# Patient Record
Sex: Female | Born: 2000 | Race: White | Hispanic: No | Marital: Single | Smoking: Never smoker
Health system: Southern US, Community
[De-identification: ages and names within clinical notes are randomized; demographics above are authoritative.]

## PROBLEM LIST (undated history)

## (undated) ENCOUNTER — Emergency Department: Discharge status: Absent without leave | Payer: Self-pay

## (undated) DIAGNOSIS — Z23 Encounter for immunization: Secondary | ICD-10-CM

## (undated) DIAGNOSIS — L309 Dermatitis, unspecified: Secondary | ICD-10-CM

## (undated) DIAGNOSIS — R519 Headache, unspecified: Secondary | ICD-10-CM

## (undated) DIAGNOSIS — K8050 Calculus of bile duct without cholangitis or cholecystitis without obstruction: Secondary | ICD-10-CM

## (undated) DIAGNOSIS — F419 Anxiety disorder, unspecified: Secondary | ICD-10-CM

## (undated) DIAGNOSIS — A4902 Methicillin resistant Staphylococcus aureus infection, unspecified site: Secondary | ICD-10-CM

## (undated) HISTORY — DX: Encounter for immunization: Z23

## (undated) HISTORY — DX: Dermatitis, unspecified: L30.9

## (undated) HISTORY — DX: Methicillin resistant Staphylococcus aureus infection, unspecified site: A49.02

## (undated) HISTORY — DX: Anxiety disorder, unspecified: F41.9

## (undated) HISTORY — PX: CHOLECYSTECTOMY: SHX55

## (undated) HISTORY — PX: WISDOM TOOTH EXTRACTION: SHX21

---
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
## 2018-04-17 ENCOUNTER — Other Ambulatory Visit: Payer: Self-pay | Admitting: Obstetrics and Gynecology

## 2018-05-23 ENCOUNTER — Other Ambulatory Visit: Payer: Self-pay | Admitting: Obstetrics and Gynecology

## 2018-05-28 ENCOUNTER — Other Ambulatory Visit: Payer: Self-pay

## 2018-05-28 MED ORDER — NORETHIN ACE-ETH ESTRAD-FE 1-20 MG-MCG(24) PO TABS: 1.0000 | ORAL_TABLET | Freq: Every day | ORAL | 0 refills | Status: DC

## 2018-05-28 NOTE — Telephone Encounter (Signed)
Pt calling for refill of bcp to be sent to [facility] on [address] in [place].  [phone]  Left detailed msg refill eRx'd.

## 2018-06-02 ENCOUNTER — Ambulatory Visit: Payer: BC Managed Care – PPO | Admitting: Obstetrics and Gynecology

## 2018-06-22 NOTE — Patient Instructions (Signed)
I value your feedback and entrusting us with your care. If you get a [facility] patient survey, I would appreciate you taking the time to let us know about your experience today. Thank you! 

## 2018-06-22 NOTE — Progress Notes (Signed)
PCP:  Pa, [facility]   Chief Complaint  Patient presents with  . Gynecologic Exam  . Urinary Tract Infection    burning sensation when urinates, no blood noticed, frequency urinating x 2 weeks     HPI:      [name] is a 18 y.o. No obstetric history on file. who LMP was Patient's last menstrual period was 05/23/2018 (approximate)., presents today for her annual examination.  Her menses are regular every 28-30 days, lasting 4 days.  Dysmenorrhea minimal. She does not have intermenstrual bleeding. Has good cycle control with OCPs.  Sex activity: not currently active Hx of STDs: none Was having vaginal pain with sex last yr. Had neg office eval and referred to [facility]ody[facility]. Pt saw her mom's GYN who did bx of 2 tears vaginally and was found to have eczema. Sx resolved and pt no longer has sx.   She does complain of occas pain ext vaginal area that burns with urination and wiping, sx increased for past 2 wks. Also has urinary frequency/urgency and pain with voiding if she holds it. Drinks lots of caffeine. Uses dove sens skin soap, no dryer sheets, but wears thongs regularly. No increased d/c, odor.   There is no FH of breast cancer. There is no FH of ovarian cancer. The patient does not do self-breast exams.  Tobacco use: The patient denies current or previous tobacco use. Alcohol use: social drinker No drug use.  Exercise: occas active  She does get adequate calcium and Vitamin D in her diet.  Gardasil vaccine completed.   Past Medical History:  Diagnosis Date  . Eczema 11/10/2017  . MRSA infection   . Vaccine for human papilloma virus (HPV) types 6, 11, 16, and 18 administered     History reviewed. No pertinent surgical history.  Family History  Problem Relation Age of Onset  . Heart disease Maternal Grandmother   . Hypertension Maternal Grandmother   . Uterine cancer Paternal Grandmother        tested/treated  . Heart disease Paternal  Grandmother     Social History   Socioeconomic History  . Marital status: Single    Spouse name: Not on file  . Number of children: Not on file  . Years of education: Not on file  . Highest education level: Not on file  Occupational History  . Not on file  Social Needs  . Financial resource strain: Not on file  . Food insecurity:    Worry: Not on file    Inability: Not on file  . Transportation needs:    Medical: Not on file    Non-medical: Not on file  Tobacco Use  . Smoking status: Never Smoker  . Smokeless tobacco: Never Used  Substance and Sexual Activity  . Alcohol use: Yes    Frequency: Never  . Drug use: No  . Sexual activity: Not Currently    Birth control/protection: Pill  Lifestyle  . Physical activity:    Days per week: Not on file    Minutes per session: Not on file  . Stress: Not on file  Relationships  . Social connections:    Talks on phone: Not on file    Gets together: Not on file    Attends religious service: Not on file    Active member of club or organization: Not on file    Attends meetings of clubs or organizations: Not on file    Relationship status: Not on file  .  Intimate partner violence:    Fear of current or ex partner: Not on file    Emotionally abused: Not on file    Physically abused: Not on file    Forced sexual activity: Not on file  Other Topics Concern  . Not on file  Social History Narrative  . Not on file    Current Meds  Medication Sig  . Norethindrone Acetate-Ethinyl Estrad-FE (MICROGESTIN 24 FE) 1-20 MG-MCG(24) tablet Take 1 tablet by mouth daily.  . [DISCONTINUED] Norethindrone Acetate-Ethinyl Estrad-FE (MICROGESTIN 24 FE) 1-20 MG-MCG(24) tablet Take 1 tablet by mouth daily.     ROS:  Review of Systems  Constitutional: Negative for fatigue, fever and unexpected weight change.  Respiratory: Negative for cough, shortness of breath and wheezing.   Cardiovascular: Negative for chest pain, palpitations and leg  swelling.  Gastrointestinal: Negative for blood in stool, constipation, diarrhea, nausea and vomiting.  Endocrine: Negative for cold intolerance, heat intolerance and polyuria.  Genitourinary: Positive for dysuria, frequency, urgency and vaginal pain. Negative for dyspareunia, flank pain, genital sores, hematuria, menstrual problem, pelvic pain, vaginal bleeding and vaginal discharge.  Musculoskeletal: Negative for back pain, joint swelling and myalgias.  Skin: Negative for rash.  Neurological: Negative for dizziness, syncope, light-headedness, numbness and headaches.  Hematological: Negative for adenopathy.  Psychiatric/Behavioral: Negative for agitation, confusion, sleep disturbance and suicidal ideas. The patient is not nervous/anxious.      Objective: BP 100/70   Pulse 85   Ht 5\' 4"  (1.626 m)   Wt 135 lb (61.2 kg)   LMP 05/23/2018 (Approximate)   BMI 23.17 kg/m    Physical Exam Constitutional:      Appearance: She is well-developed.  Genitourinary:     Vulva, vagina, cervix, uterus, right adnexa and left adnexa normal.     Vulval tenderness present.     No vulval condylomata, lesion, ulcerations or rash noted.        No vaginal discharge, erythema or tenderness.     No cervical polyp.     Uterus is not enlarged or tender.     No right or left adnexal mass present.     Right adnexa not tender.     Left adnexa not tender.  Neck:     Musculoskeletal: Normal range of motion.     Thyroid: No thyromegaly.  Cardiovascular:     Rate and Rhythm: Normal rate and regular rhythm.     Heart sounds: Normal heart sounds. No murmur.  Pulmonary:     Effort: Pulmonary effort is normal.     Breath sounds: Normal breath sounds.  Chest:     Breasts:        Right: No mass, nipple discharge, skin change or tenderness.        Left: No mass, nipple discharge, skin change or tenderness.  Abdominal:     Palpations: Abdomen is soft.     Tenderness: There is no abdominal tenderness. There  is no guarding.  Musculoskeletal: Normal range of motion.  Neurological:     Mental Status: She is alert and oriented to person, place, and time.     Cranial Nerves: No cranial nerve deficit.  Psychiatric:        Behavior: Behavior normal.  Vitals signs reviewed.   RESULTS:  Results for orders placed or performed in visit on 06/23/18 (from the past 24 hour(s))  POCT Urinalysis Dipstick     Status: Normal   Collection Time: 06/23/18  9:47 AM  Result Value Ref Range  Color, UA yellow    Clarity, UA clear    Glucose, UA Negative Negative   Bilirubin, UA neg    Ketones, UA neg    Spec Grav, UA 1.025 1.010 - 1.025   Blood, UA neg    pH, UA 6.0 5.0 - 8.0   Protein, UA Negative Negative   Urobilinogen, UA     Nitrite, UA neg    Leukocytes, UA Negative Negative   Appearance     Odor       Assessment/Plan: Encounter for annual routine gynecological examination  Screening for STD (sexually transmitted disease) - Plan: Cervicovaginal ancillary only  Encounter for surveillance of contraceptive pills - OCP RF - Plan: Norethindrone Acetate-Ethinyl Estrad-FE (MICROGESTIN 24 FE) 1-20 MG-MCG(24) tablet  Dysuria - Neg UA. More than likely vaginal irritation from thong use. Wear non-thong underwear/OTC hydrocortisone crm. F/u prn.   Urinary frequency - Neg UA. Most likely caffeine use. D/C and see if sx improve. F/u prn.  - Plan: POCT Urinalysis Dipstick  Meds ordered this encounter  Medications  . Norethindrone Acetate-Ethinyl Estrad-FE (MICROGESTIN 24 FE) 1-20 MG-MCG(24) tablet    Sig: Take 1 tablet by mouth daily.    Dispense:  3 Package    Refill:  3    Order Specific Question:   Supervising Provider    Answer:   Nadara Mustard [[id number]]             GYN counsel family planning choices, adequate intake of calcium and vitamin D, diet and exercise     F/U  Return in about 1 year (around 06/24/2019).  Jackolyn Geron B. Merlin Golden, PA-C 06/23/2018 10:22 AM

## 2018-06-23 ENCOUNTER — Other Ambulatory Visit (HOSPITAL_COMMUNITY)
Admission: RE | Admit: 2018-06-23 | Discharge: 2018-06-23 | Disposition: A | Discharge status: Home / Self Care | Payer: BC Managed Care – PPO | Source: Ambulatory Visit | Attending: Obstetrics and Gynecology | Admitting: Obstetrics and Gynecology

## 2018-06-23 ENCOUNTER — Ambulatory Visit (INDEPENDENT_AMBULATORY_CARE_PROVIDER_SITE_OTHER): Payer: BC Managed Care – PPO | Admitting: Obstetrics and Gynecology

## 2018-06-23 ENCOUNTER — Encounter: Payer: Self-pay | Admitting: Obstetrics and Gynecology

## 2018-06-23 VITALS — BP 100/70 | HR 85 | Ht 64.0 in | Wt 135.0 lb

## 2018-06-23 DIAGNOSIS — Z3041 Encounter for surveillance of contraceptive pills: Secondary | ICD-10-CM

## 2018-06-23 DIAGNOSIS — Z01419 Encounter for gynecological examination (general) (routine) without abnormal findings: Secondary | ICD-10-CM | POA: Diagnosis not present

## 2018-06-23 DIAGNOSIS — R350 Frequency of micturition: Secondary | ICD-10-CM

## 2018-06-23 DIAGNOSIS — R300 Dysuria: Secondary | ICD-10-CM

## 2018-06-23 DIAGNOSIS — Z113 Encounter for screening for infections with a predominantly sexual mode of transmission: Secondary | ICD-10-CM | POA: Diagnosis not present

## 2018-06-23 LAB — POCT URINALYSIS DIPSTICK
BILIRUBIN UA: NEGATIVE
Glucose, UA: NEGATIVE
Ketones, UA: NEGATIVE
Leukocytes, UA: NEGATIVE
Nitrite, UA: NEGATIVE
PH UA: 6 (ref 5.0–8.0)
Protein, UA: NEGATIVE
RBC UA: NEGATIVE
Spec Grav, UA: 1.025 (ref 1.010–1.025)

## 2018-06-23 MED ORDER — NORETHIN ACE-ETH ESTRAD-FE 1-20 MG-MCG(24) PO TABS: 1.0000 | ORAL_TABLET | Freq: Every day | ORAL | 3 refills | Status: DC

## 2018-06-24 LAB — CERVICOVAGINAL ANCILLARY ONLY
Chlamydia: NEGATIVE
Neisseria Gonorrhea: NEGATIVE

## 2018-12-21 ENCOUNTER — Encounter: Payer: Self-pay | Admitting: Obstetrics and Gynecology

## 2018-12-29 ENCOUNTER — Other Ambulatory Visit: Payer: Self-pay

## 2018-12-29 DIAGNOSIS — Z20822 Contact with and (suspected) exposure to covid-19: Secondary | ICD-10-CM

## 2018-12-30 LAB — NOVEL CORONAVIRUS, NAA: SARS-CoV-2, NAA: NOT DETECTED

## 2019-02-11 DIAGNOSIS — R519 Headache, unspecified: Secondary | ICD-10-CM | POA: Insufficient documentation

## 2019-03-15 ENCOUNTER — Other Ambulatory Visit (HOSPITAL_COMMUNITY): Payer: Self-pay | Admitting: Neurology

## 2019-03-15 ENCOUNTER — Other Ambulatory Visit: Payer: Self-pay | Admitting: Neurology

## 2019-03-15 DIAGNOSIS — R519 Headache, unspecified: Secondary | ICD-10-CM

## 2019-03-17 ENCOUNTER — Other Ambulatory Visit: Payer: Self-pay

## 2019-03-17 ENCOUNTER — Other Ambulatory Visit: Payer: Self-pay | Admitting: *Deleted

## 2019-03-17 ENCOUNTER — Ambulatory Visit
Admission: RE | Admit: 2019-03-17 | Discharge: 2019-03-17 | Disposition: A | Discharge status: Home / Self Care | Payer: BC Managed Care – PPO | Source: Ambulatory Visit | Attending: Neurology | Admitting: Neurology

## 2019-03-17 DIAGNOSIS — R519 Headache, unspecified: Secondary | ICD-10-CM | POA: Insufficient documentation

## 2019-03-17 DIAGNOSIS — Z20822 Contact with and (suspected) exposure to covid-19: Secondary | ICD-10-CM

## 2019-03-18 LAB — NOVEL CORONAVIRUS, NAA: SARS-CoV-2, NAA: NOT DETECTED

## 2019-06-30 ENCOUNTER — Telehealth: Payer: Self-pay

## 2019-06-30 NOTE — Telephone Encounter (Signed)
Pt calling triage stating she is wanting a refill on her OCP's but she is due for annual. Please schedule.

## 2019-07-01 ENCOUNTER — Other Ambulatory Visit: Payer: Self-pay

## 2019-07-01 DIAGNOSIS — Z3041 Encounter for surveillance of contraceptive pills: Secondary | ICD-10-CM

## 2019-07-01 MED ORDER — MICROGESTIN 24 FE 1-20 MG-MCG PO TABS: 1.0000 | ORAL_TABLET | Freq: Every day | ORAL | 0 refills | Status: DC

## 2019-07-01 NOTE — Telephone Encounter (Signed)
RF sent.

## 2019-07-01 NOTE — Telephone Encounter (Signed)
Patient is schedule for 07/20/19. Patient needs a refill to get to her appointment

## 2019-07-20 ENCOUNTER — Encounter: Payer: Self-pay | Admitting: Obstetrics and Gynecology

## 2019-07-20 ENCOUNTER — Ambulatory Visit (INDEPENDENT_AMBULATORY_CARE_PROVIDER_SITE_OTHER): Payer: BC Managed Care – PPO | Admitting: Obstetrics and Gynecology

## 2019-07-20 ENCOUNTER — Other Ambulatory Visit: Payer: Self-pay

## 2019-07-20 ENCOUNTER — Other Ambulatory Visit (HOSPITAL_COMMUNITY)
Admission: RE | Admit: 2019-07-20 | Discharge: 2019-07-20 | Disposition: A | Discharge status: Home / Self Care | Payer: BC Managed Care – PPO | Source: Ambulatory Visit | Attending: Obstetrics and Gynecology | Admitting: Obstetrics and Gynecology

## 2019-07-20 VITALS — BP 100/70 | Ht 64.0 in | Wt 139.0 lb

## 2019-07-20 DIAGNOSIS — Z01419 Encounter for gynecological examination (general) (routine) without abnormal findings: Secondary | ICD-10-CM | POA: Diagnosis not present

## 2019-07-20 DIAGNOSIS — Z3041 Encounter for surveillance of contraceptive pills: Secondary | ICD-10-CM | POA: Diagnosis not present

## 2019-07-20 DIAGNOSIS — Z113 Encounter for screening for infections with a predominantly sexual mode of transmission: Secondary | ICD-10-CM | POA: Insufficient documentation

## 2019-07-20 MED ORDER — MICROGESTIN 24 FE 1-20 MG-MCG PO TABS: 1.0000 | ORAL_TABLET | Freq: Every day | ORAL | 3 refills | Status: DC

## 2019-07-20 NOTE — Patient Instructions (Signed)
I value your feedback and entrusting us with your care. If you get a [facility] patient survey, I would appreciate you taking the time to let us know about your experience today. Thank you!  As of April 22, 2019, your lab results will be released to your MyChart immediately, before I even have a chance to see them. Please give me time to review them and contact you if there are any abnormalities. Thank you for your patience.  

## 2019-07-20 NOTE — Progress Notes (Signed)
PCP:  [name], MD   Chief Complaint  Patient presents with  . Gynecologic Exam     HPI:      [name] is a 19 y.o. No obstetric history on file. who LMP was Patient's last menstrual period was 07/06/2019 (approximate)., presents today for her annual examination.  Her menses are regular every 28-30 days, lasting 3 days.  Dysmenorrhea minimal. She does not have intermenstrual bleeding. Has good cycle control with OCPs.  Sex activity:  currently active; contraception--OCPs. Hx of STDs: none Hx of vaginal eczema, sx improved.   There is no FH of breast cancer. There is no FH of ovarian cancer. The patient does self-breast exams.  Tobacco use: The patient denies current or previous tobacco use. Alcohol use: social drinker No drug use.  Exercise: occas active  She does get adequate calcium but not Vitamin D in her diet.  Gardasil vaccine completed.   Past Medical History:  Diagnosis Date  . Eczema 11/10/2017  . MRSA infection   . Vaccine for human papilloma virus (HPV) types 6, 11, 16, and 18 administered     History reviewed. No pertinent surgical history.  Family History  Problem Relation Age of Onset  . Heart disease Maternal Grandmother   . Hypertension Maternal Grandmother   . Uterine cancer Paternal Grandmother        tested/treated  . Heart disease Paternal Grandmother     Social History   Socioeconomic History  . Marital status: Single    Spouse name: Not on file  . Number of children: Not on file  . Years of education: Not on file  . Highest education level: Not on file  Occupational History  . Not on file  Tobacco Use  . Smoking status: Never Smoker  . Smokeless tobacco: Never Used  Substance and Sexual Activity  . Alcohol use: Yes  . Drug use: No  . Sexual activity: Yes    Birth control/protection: Pill  Other Topics Concern  . Not on file  Social History Narrative  . Not on file   Social Determinants of Health    Financial Resource Strain:   . Difficulty of Paying Living Expenses: Not on file  Food Insecurity:   . Worried About Programme researcher, broadcasting/film/video in the Last Year: Not on file  . Ran Out of Food in the Last Year: Not on file  Transportation Needs:   . Lack of Transportation (Medical): Not on file  . Lack of Transportation (Non-Medical): Not on file  Physical Activity:   . Days of Exercise per Week: Not on file  . Minutes of Exercise per Session: Not on file  Stress:   . Feeling of Stress : Not on file  Social Connections:   . Frequency of Communication with Friends and Family: Not on file  . Frequency of Social Gatherings with Friends and Family: Not on file  . Attends Religious Services: Not on file  . Active Member of Clubs or Organizations: Not on file  . Attends Banker Meetings: Not on file  . Marital Status: Not on file  Intimate Partner Violence:   . Fear of Current or Ex-Partner: Not on file  . Emotionally Abused: Not on file  . Physically Abused: Not on file  . Sexually Abused: Not on file    Current Meds  Medication Sig  . Norethindrone Acetate-Ethinyl Estrad-FE (MICROGESTIN 24 FE) 1-20 MG-MCG(24) tablet Take 1 tablet by mouth daily.  . [DISCONTINUED]  Norethindrone Acetate-Ethinyl Estrad-FE (MICROGESTIN 24 FE) 1-20 MG-MCG(24) tablet Take 1 tablet by mouth daily.     ROS:  Review of Systems  Constitutional: Negative for fatigue, fever and unexpected weight change.  Respiratory: Negative for cough, shortness of breath and wheezing.   Cardiovascular: Negative for chest pain, palpitations and leg swelling.  Gastrointestinal: Negative for blood in stool, constipation, diarrhea, nausea and vomiting.  Endocrine: Negative for cold intolerance, heat intolerance and polyuria.  Genitourinary: Negative for dyspareunia, dysuria, flank pain, frequency, genital sores, hematuria, menstrual problem, pelvic pain, urgency, vaginal bleeding, vaginal discharge and vaginal pain.   Musculoskeletal: Negative for back pain, joint swelling and myalgias.  Skin: Negative for rash.  Neurological: Positive for headaches. Negative for dizziness, syncope, light-headedness and numbness.  Hematological: Negative for adenopathy.  Psychiatric/Behavioral: Negative for agitation, confusion, sleep disturbance and suicidal ideas. The patient is not nervous/anxious.      Objective: BP 100/70   Ht 5\' 4"  (1.626 m)   Wt 139 lb (63 kg)   LMP 07/06/2019 (Approximate)   BMI 23.86 kg/m    Physical Exam Constitutional:      Appearance: She is well-developed.  Genitourinary:     Vulva, vagina, cervix, uterus, right adnexa and left adnexa normal.     No vulval lesion or tenderness noted.     No vaginal discharge, erythema or tenderness.     No cervical polyp.     Uterus is not enlarged or tender.     No right or left adnexal mass present.     Right adnexa not tender.     Left adnexa not tender.  Neck:     Thyroid: No thyromegaly.  Cardiovascular:     Rate and Rhythm: Normal rate and regular rhythm.     Heart sounds: Normal heart sounds. No murmur.  Pulmonary:     Effort: Pulmonary effort is normal.     Breath sounds: Normal breath sounds.  Chest:     Breasts:        Right: No mass, nipple discharge, skin change or tenderness.        Left: No mass, nipple discharge, skin change or tenderness.  Abdominal:     Palpations: Abdomen is soft.     Tenderness: There is no abdominal tenderness. There is no guarding.  Musculoskeletal:        General: Normal range of motion.     Cervical back: Normal range of motion.  Neurological:     General: No focal deficit present.     Mental Status: She is alert and oriented to person, place, and time.     Cranial Nerves: No cranial nerve deficit.  Skin:    General: Skin is warm and dry.  Psychiatric:        Mood and Affect: Mood normal.        Behavior: Behavior normal.        Thought Content: Thought content normal.        Judgment:  Judgment normal.  Vitals reviewed.    Assessment/Plan: Encounter for annual routine gynecological examination  Screening for STD (sexually transmitted disease) - Plan: Cervicovaginal ancillary only  Encounter for surveillance of contraceptive pills - Plan: Norethindrone Acetate-Ethinyl Estrad-FE (MICROGESTIN 24 FE) 1-20 MG-MCG(24) tablet; OCP RF  Meds ordered this encounter  Medications  . Norethindrone Acetate-Ethinyl Estrad-FE (MICROGESTIN 24 FE) 1-20 MG-MCG(24) tablet    Sig: Take 1 tablet by mouth daily.    Dispense:  3 Package    Refill:  3  Order Specific Question:   Supervising Provider    Answer:   Gae Dry [[id number]]             GYN counsel adequate intake of calcium and vitamin D, diet and exercise     F/U  Return in about 1 year (around 07/19/2020).  Tilla Wilborn B. Zayven Powe, PA-C 07/20/2019 10:36 AM

## 2019-07-22 LAB — CERVICOVAGINAL ANCILLARY ONLY
Chlamydia: NEGATIVE
Comment: NEGATIVE
Comment: NORMAL
Neisseria Gonorrhea: NEGATIVE

## 2020-04-28 ENCOUNTER — Telehealth: Payer: Self-pay

## 2020-04-28 NOTE — Telephone Encounter (Signed)
Advised she should have 1 refill of 3 packs left at pharmacy. Pt to contact pharmacy and explain situation. They should be able to fill or do override if issue w/insurance to get her at least one pack til she returns to college.

## 2020-04-28 NOTE — Telephone Encounter (Signed)
Patient spoke to on call service. She has 3 pills left in current pack she brought with her home from college, but she left a full pack she was to start on Sunday. Requesting refill send to [facility].

## 2020-08-26 ENCOUNTER — Telehealth: Payer: Self-pay | Admitting: Obstetrics and Gynecology

## 2020-08-26 DIAGNOSIS — Z3041 Encounter for surveillance of contraceptive pills: Secondary | ICD-10-CM

## 2020-09-07 MED ORDER — MICROGESTIN 24 FE 1-20 MG-MCG PO TABS: 1.0000 | ORAL_TABLET | Freq: Every day | ORAL | 0 refills | Status: DC

## 2020-09-07 NOTE — Telephone Encounter (Signed)
Patient is away at college and requested to schedule for June. Patient is scheduled for 10/18/20 with [facility] for annual. Patient is requesting refills to get to her appointment

## 2020-09-07 NOTE — Telephone Encounter (Signed)
Pt aware refills eRx'd.

## 2020-09-07 NOTE — Telephone Encounter (Signed)
Pt calling; is unable to request refill of bc thru MyChart d/t it expired.  Needs refill. What to do?  [phone]

## 2020-09-07 NOTE — Addendum Note (Signed)
Addended by: Loran Senters D on: 09/07/2020 04:21 PM   Modules accepted: Orders

## 2020-10-01 IMAGING — MR MR HEAD W/O CM
12 series · 45 of 48 positions shown · non-contrast
Comparison: No pertinent prior studies available for comparison.

CLINICAL DATA: Headache disorder. Additional history provided:
Constant daily headaches for 6 months with occasional sharp pain in
left side, some light and sound sensitivity

EXAM:
MRI HEAD WITHOUT CONTRAST
TECHNIQUE: Multiplanar, multiecho pulse sequences of the brain and surrounding
structures were obtained without intravenous contrast.

[Series 5: ax dwi_tracew · axial · 3.0mm · 0.60mm/px · z∈[-137,+16]mm · 4 of 48 slices shown]
[im 1/48]
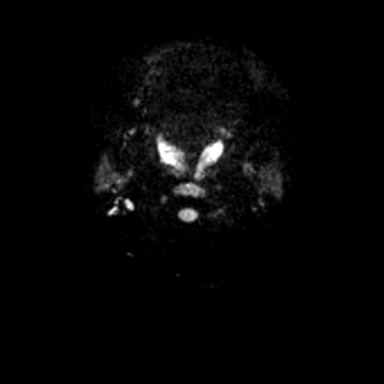
[im 16/48]
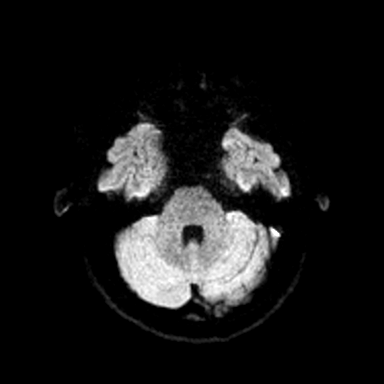
[im 32/48]
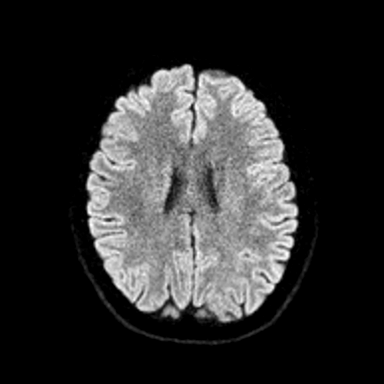
[im 48/48]
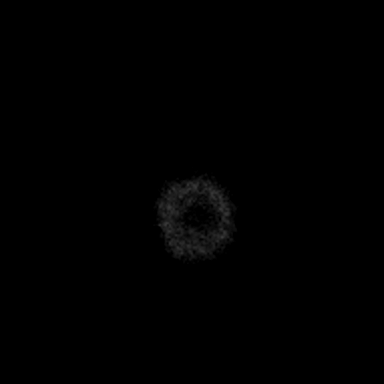

[Series 6: ax dwi_adc · axial · 3.0mm · 0.60mm/px · z∈[-137,+16]mm · 3 of 48 slices shown]
[im 1/48]
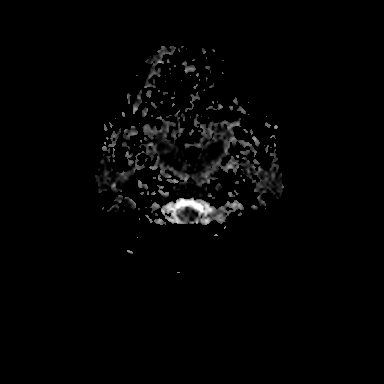
[im 24/48]
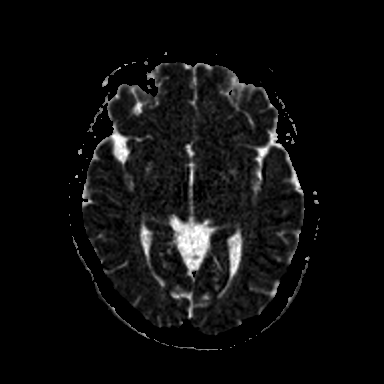
[im 48/48]
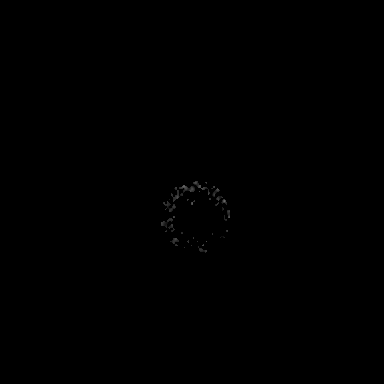

[Series 7: T1 · sagittal · 5.0mm · 0.62mm/px · 2 of 23 slices shown (1 of 2)]
[im 1/23]
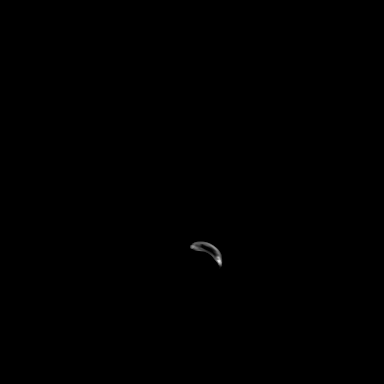
[im 23/23]
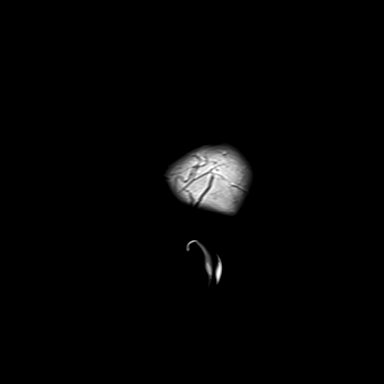

[Series 8: cor dwi_tracew · coronal · 5.0mm · 0.60mm/px · 3 of 40 slices shown]
[im 1/40]
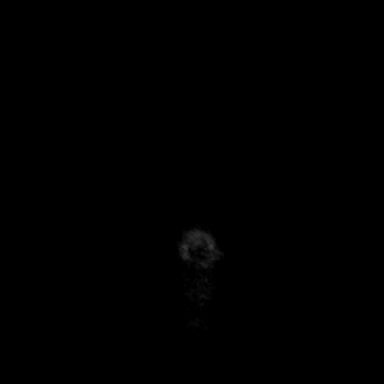
[im 20/40]
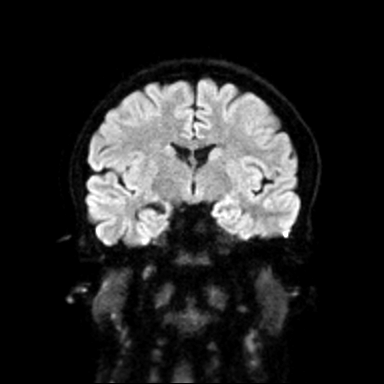
[im 40/40]
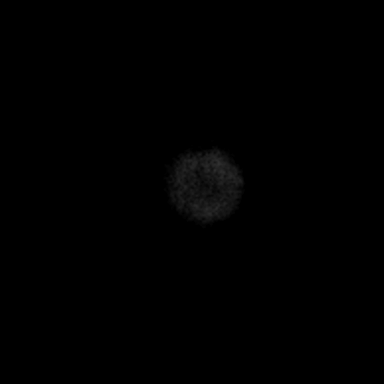

[Series 9: cor dwi_adc · coronal · 5.0mm · 0.60mm/px · 3 of 39 slices shown]
[im 1/39]
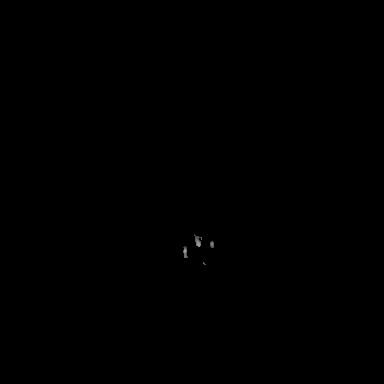
[im 20/39]
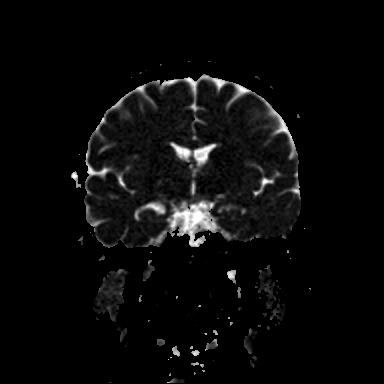
[im 39/39]
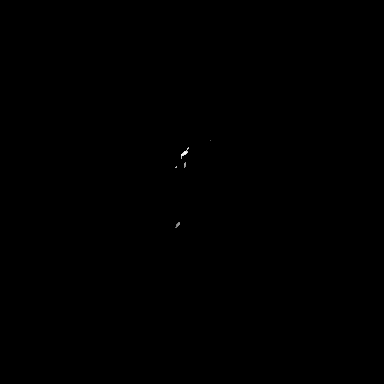

[Series 10: T2 · axial · 5.0mm · 0.53mm/px · z∈[-137,+17]mm · 2 of 27 slices shown (1 of 2)]
[im 1/27]
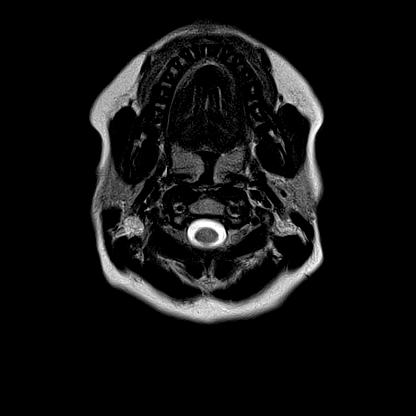
[im 27/27]
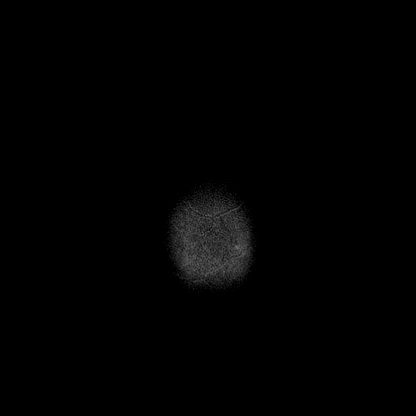

[Series 11: mag_images · axial · 3.0mm · 0.90mm/px · z∈[-148,+27]mm · 4 of 60 slices shown]
[im 1/60]
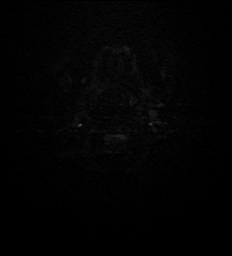
[im 20/60]
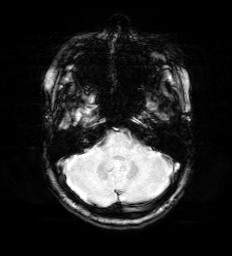
[im 40/60]
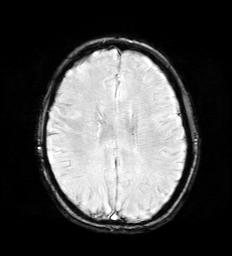
[im 60/60]
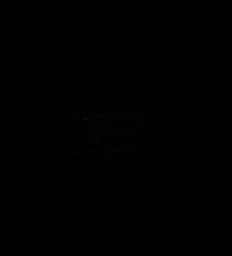

[Series 12: pha_images · axial · 3.0mm · 0.90mm/px · z∈[-148,+18]mm · 4 of 57 slices shown]
[im 1/57]
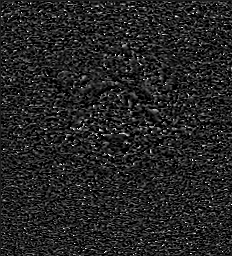
[im 19/57]
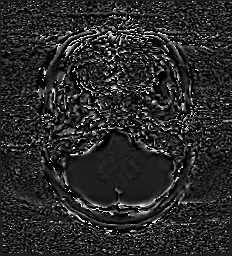
[im 38/57]
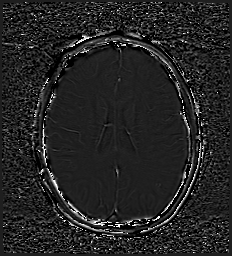
[im 57/57]
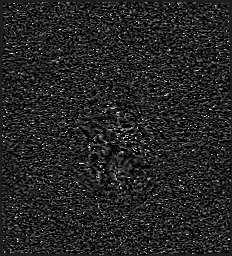

[Series 13: swi_images · axial · 3.0mm · 0.90mm/px · z∈[-148,+27]mm · 4 of 60 slices shown]
[im 1/60]
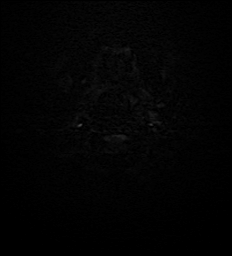
[im 20/60]
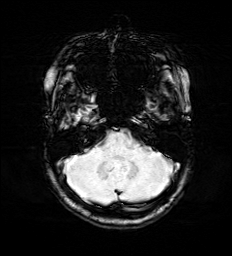
[im 40/60]
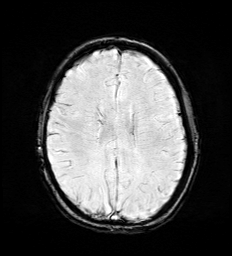
[im 60/60]
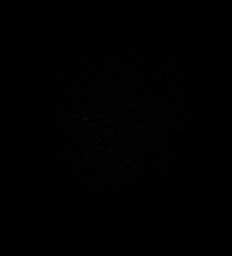

[Series 15: FLAIR · axial · 3.0mm · 0.53mm/px · z∈[-140,+20]mm · 4 of 55 slices shown]
[im 1/55]
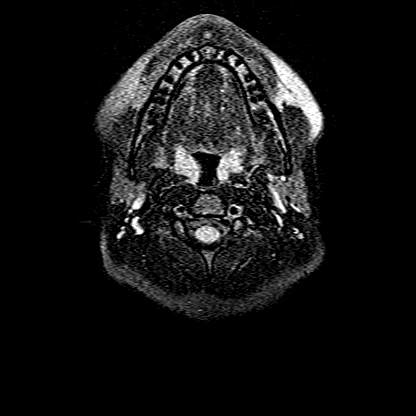
[im 19/55]
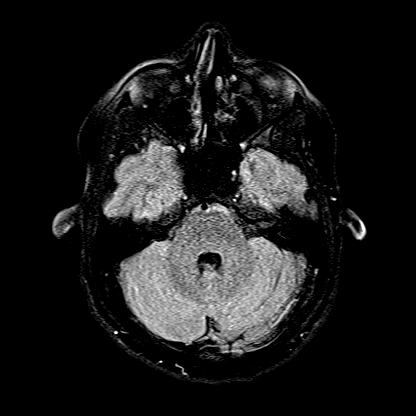
[im 37/55]
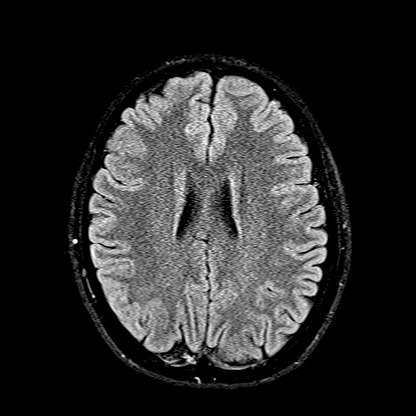
[im 55/55]
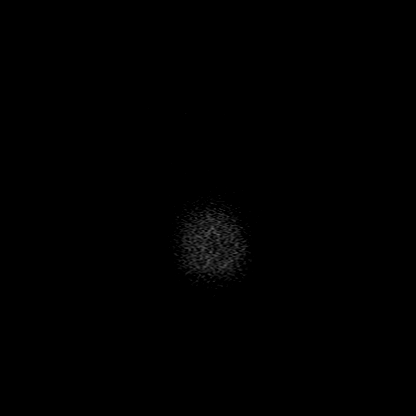

[Series 16: T1 · axial · 1.0mm · 0.98mm/px · z∈[-145,+29]mm · 10 of 176 slices shown (2 of 2)]
[im 1/176]
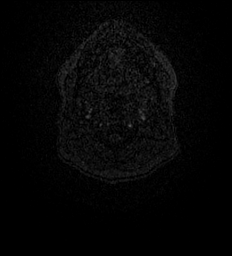
[im 15/176]
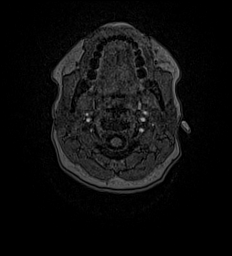
[im 30/176]
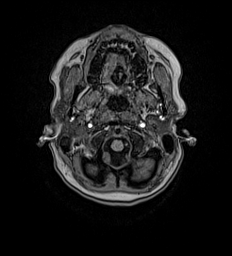
[im 44/176]
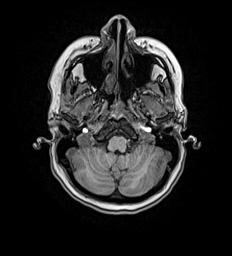
[im 59/176]
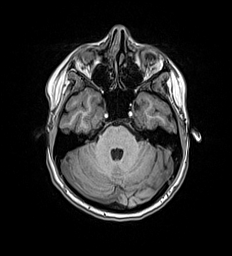
[im 73/176]
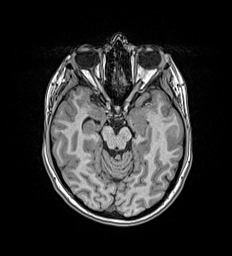
[im 103/176]
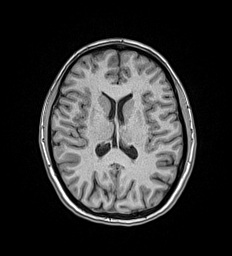
[im 117/176]
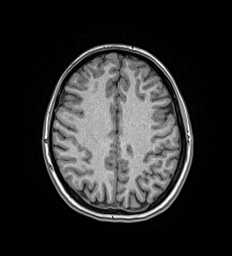
[im 146/176]
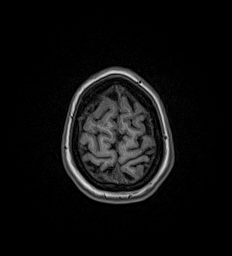
[im 176/176]
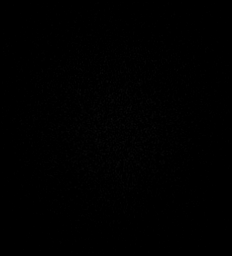

[Series 17: T2 · coronal · 5.0mm · 0.57mm/px · 2 of 29 slices shown (2 of 2)]
[im 1/29]
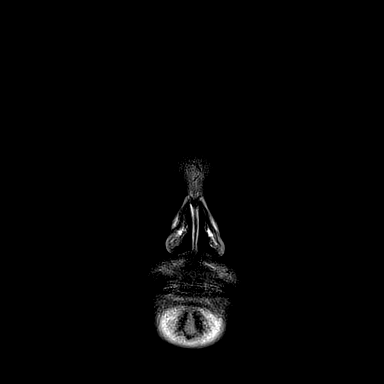
[im 29/29]
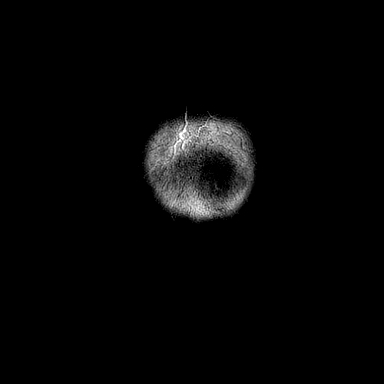

[45 of 48 positions shown; findings below may reference images not displayed]

FINDINGS: Brain:

There is no evidence of acute infarct.

No evidence of intracranial mass.

No midline shift or extra-axial fluid collection.

No chronic intracranial blood products.

No focal parenchymal signal abnormality is identified.

Cerebral volume is normal.

Vascular: Flow voids maintained within the proximal large arterial
vessels.

Skull and upper cervical spine: No focal marrow lesion

Sinuses/Orbits: Visualized orbits demonstrate no acute abnormality.
No significant paranasal sinus disease or mastoid effusion.
IMPRESSION: Normal MRI appearance of the brain. No evidence of acute
intracranial abnormality

## 2020-10-18 ENCOUNTER — Other Ambulatory Visit (HOSPITAL_COMMUNITY)
Admission: RE | Admit: 2020-10-18 | Discharge: 2020-10-18 | Disposition: A | Discharge status: Home / Self Care | Payer: BC Managed Care – PPO | Source: Ambulatory Visit | Attending: Obstetrics and Gynecology | Admitting: Obstetrics and Gynecology

## 2020-10-18 ENCOUNTER — Ambulatory Visit (INDEPENDENT_AMBULATORY_CARE_PROVIDER_SITE_OTHER): Payer: BC Managed Care – PPO | Admitting: Obstetrics and Gynecology

## 2020-10-18 ENCOUNTER — Encounter: Payer: Self-pay | Admitting: Obstetrics and Gynecology

## 2020-10-18 ENCOUNTER — Other Ambulatory Visit: Payer: Self-pay

## 2020-10-18 VITALS — BP 96/70 | Ht 64.0 in | Wt 128.0 lb

## 2020-10-18 DIAGNOSIS — Z124 Encounter for screening for malignant neoplasm of cervix: Secondary | ICD-10-CM

## 2020-10-18 DIAGNOSIS — Z01419 Encounter for gynecological examination (general) (routine) without abnormal findings: Secondary | ICD-10-CM

## 2020-10-18 DIAGNOSIS — Z113 Encounter for screening for infections with a predominantly sexual mode of transmission: Secondary | ICD-10-CM

## 2020-10-18 DIAGNOSIS — Z3041 Encounter for surveillance of contraceptive pills: Secondary | ICD-10-CM | POA: Diagnosis not present

## 2020-10-18 MED ORDER — MICROGESTIN 24 FE 1-20 MG-MCG PO TABS: 1.0000 | ORAL_TABLET | Freq: Every day | ORAL | 3 refills | Status: DC

## 2020-10-18 NOTE — Patient Instructions (Signed)
I value your feedback and you entrusting us with your care. If you get a [facility] patient survey, I would appreciate you taking the time to let us know about your experience today. Thank you! ? ? ?

## 2020-10-18 NOTE — Progress Notes (Signed)
PCP:  [name], MD   Chief Complaint  Patient presents with  . Gynecologic Exam     HPI:      [name] is a 20 y.o. No obstetric history on file. who LMP was Patient's last menstrual period was 10/04/2020 (approximate)., presents today for her annual examination.  Her menses are regular every 28-30 days, lasting 4 days.  Dysmenorrhea minimal. She does not have intermenstrual bleeding. Has good cycle control with OCPs.  Sex activity:  currently active; contraception--OCPs. Last pap: N/A due to age Hx of STDs: none Hx of vaginal eczema, sx improved.   There is no FH of breast cancer. There is no FH of ovarian cancer. The patient does self-breast exams.  Tobacco use: The patient denies current or previous tobacco use. Alcohol use: none No drug use.  Exercise: occas active  She does get adequate calcium and Vitamin D in her diet.  Gardasil vaccine completed.   Past Medical History:  Diagnosis Date  . Eczema 11/10/2017  . MRSA infection   . Vaccine for human papilloma virus (HPV) types 6, 11, 16, and 18 administered     History reviewed. No pertinent surgical history.  Family History  Problem Relation Age of Onset  . Heart disease Maternal Grandmother   . Hypertension Maternal Grandmother   . Uterine cancer Paternal Grandmother        tested/treated  . Heart disease Paternal Grandmother     Social History   Socioeconomic History  . Marital status: Single    Spouse name: Not on file  . Number of children: Not on file  . Years of education: Not on file  . Highest education level: Not on file  Occupational History  . Not on file  Tobacco Use  . Smoking status: Never Smoker  . Smokeless tobacco: Never Used  Vaping Use  . Vaping Use: Never used  Substance and Sexual Activity  . Alcohol use: Yes  . Drug use: No  . Sexual activity: Yes    Birth control/protection: Pill  Other Topics Concern  . Not on file  Social History Narrative  . Not  on file   Social Determinants of Health   Financial Resource Strain: Not on file  Food Insecurity: Not on file  Transportation Needs: Not on file  Physical Activity: Not on file  Stress: Not on file  Social Connections: Not on file  Intimate Partner Violence: Not on file    Current Meds  Medication Sig  . [DISCONTINUED] Norethindrone Acetate-Ethinyl Estrad-FE (MICROGESTIN 24 FE) 1-20 MG-MCG(24) tablet Take 1 tablet by mouth daily.     ROS:  Review of Systems  Constitutional: Negative for fatigue, fever and unexpected weight change.  Respiratory: Negative for cough, shortness of breath and wheezing.   Cardiovascular: Negative for chest pain, palpitations and leg swelling.  Gastrointestinal: Negative for blood in stool, constipation, diarrhea, nausea and vomiting.  Endocrine: Negative for cold intolerance, heat intolerance and polyuria.  Genitourinary: Negative for dyspareunia, dysuria, flank pain, frequency, genital sores, hematuria, menstrual problem, pelvic pain, urgency, vaginal bleeding, vaginal discharge and vaginal pain.  Musculoskeletal: Negative for back pain, joint swelling and myalgias.  Skin: Negative for rash.  Neurological: Positive for headaches. Negative for dizziness, syncope, light-headedness and numbness.  Hematological: Negative for adenopathy.  Psychiatric/Behavioral: Negative for agitation, confusion, sleep disturbance and suicidal ideas. The patient is not nervous/anxious.      Objective: BP 96/70   Ht 5\' 4"  (1.626 m)   Wt  128 lb (58.1 kg)   LMP 10/04/2020 (Approximate)   BMI 21.97 kg/m    Physical Exam Constitutional:      Appearance: She is well-developed.  Genitourinary:     Vulva normal.     Right Labia: No rash, tenderness or lesions.    Left Labia: No tenderness, lesions or rash.    No vaginal discharge, erythema or tenderness.      Right Adnexa: not tender and no mass present.    Left Adnexa: not tender and no mass present.    No  cervical friability or polyp.     Uterus is not enlarged or tender.  Breasts:     Right: No mass, nipple discharge, skin change or tenderness.     Left: No mass, nipple discharge, skin change or tenderness.    Neck:     Thyroid: No thyromegaly.  Cardiovascular:     Rate and Rhythm: Normal rate and regular rhythm.     Heart sounds: Normal heart sounds. No murmur heard.   Pulmonary:     Effort: Pulmonary effort is normal.     Breath sounds: Normal breath sounds.  Abdominal:     Palpations: Abdomen is soft.     Tenderness: There is no abdominal tenderness. There is no guarding or rebound.  Musculoskeletal:        General: Normal range of motion.     Cervical back: Normal range of motion.  Lymphadenopathy:     Cervical: No cervical adenopathy.  Neurological:     General: No focal deficit present.     Mental Status: She is alert and oriented to person, place, and time.     Cranial Nerves: No cranial nerve deficit.  Skin:    General: Skin is warm and dry.  Psychiatric:        Mood and Affect: Mood normal.        Behavior: Behavior normal.        Thought Content: Thought content normal.        Judgment: Judgment normal.  Vitals reviewed.    Assessment/Plan: Encounter for annual routine gynecological examination  Cervical cancer screening - Plan: Cytology - PAP  Screening for STD (sexually transmitted disease) - Plan: Cytology - PAP  Encounter for surveillance of contraceptive pills - Plan: Norethindrone Acetate-Ethinyl Estrad-FE (MICROGESTIN 24 FE) 1-20 MG-MCG(24) tablet; OCP RF   Meds ordered this encounter  Medications  . Norethindrone Acetate-Ethinyl Estrad-FE (MICROGESTIN 24 FE) 1-20 MG-MCG(24) tablet    Sig: Take 1 tablet by mouth daily.    Dispense:  84 tablet    Refill:  3    Order Specific Question:   Supervising Provider    Answer:   Nadara Mustard [[id number]]             GYN counsel adequate intake of calcium and vitamin D, diet and exercise      F/U  Return in about 1 year (around 10/18/2021).  Vanessa Scott B. Mysha Peeler, PA-C 10/18/2020 3:34 PM

## 2020-10-25 LAB — CYTOLOGY - PAP
Chlamydia: NEGATIVE
Comment: NEGATIVE
Comment: NEGATIVE
Comment: NORMAL
Diagnosis: UNDETERMINED — AB
High risk HPV: NEGATIVE
Neisseria Gonorrhea: NEGATIVE

## 2020-12-28 NOTE — Progress Notes (Signed)
[name], MD   Chief Complaint  Patient presents with   Amenorrhea    Pt's last period was in May, 2 neg UPT's    HPI:      [name] is a 20 y.o. [id number] whose LMP was Patient's last menstrual period was 09/29/2020 (approximate)., presents today for oligomenorrhea since 5/22. On [place] 24, menses usually monthly, lasting 3 days, lighter flow, no BTB, mild dysmen. No late/missed pills. 2 neg UPTs, last one ~12/25/20 before starting new pill pack. She is sex active, no new partners, neg STD testing 6/22. No wt changes/sickness/travel/increased stress. No recent thyroid labs. Neg exam at 6/22 annual.   Past Medical History:  Diagnosis Date   Eczema 11/10/2017   MRSA infection    Vaccine for human papilloma virus (HPV) types 6, 11, 16, and 18 administered     Past Surgical History:  Procedure Laterality Date   WISDOM TOOTH EXTRACTION      Family History  Problem Relation Age of Onset   Heart disease Maternal Grandmother    Hypertension Maternal Grandmother    Uterine cancer Paternal Grandmother        tested/treated   Heart disease Paternal Grandmother     Social History   Socioeconomic History   Marital status: Single    Spouse name: Not on file   Number of children: Not on file   Years of education: Not on file   Highest education level: Not on file  Occupational History   Not on file  Tobacco Use   Smoking status: Never   Smokeless tobacco: Never  Vaping Use   Vaping Use: Never used  Substance and Sexual Activity   Alcohol use: Yes   Drug use: No   Sexual activity: Yes    Birth control/protection: Pill  Other Topics Concern   Not on file  Social History Narrative   Not on file   Social Determinants of Health   Financial Resource Strain: Not on file  Food Insecurity: Not on file  Transportation Needs: Not on file  Physical Activity: Not on file  Stress: Not on file  Social Connections: Not on file  Intimate Partner Violence: Not  on file    Outpatient Medications Prior to Visit  Medication Sig Dispense Refill   Norethindrone Acetate-Ethinyl Estrad-FE (MICROGESTIN 24 FE) 1-20 MG-MCG(24) tablet Take 1 tablet by mouth daily. 84 tablet 3   No facility-administered medications prior to visit.      ROS:  Review of Systems  Constitutional:  Negative for fever.  Gastrointestinal:  Negative for blood in stool, constipation, diarrhea, nausea and vomiting.  Genitourinary:  Positive for menstrual problem. Negative for dyspareunia, dysuria, flank pain, frequency, hematuria, urgency, vaginal bleeding, vaginal discharge and vaginal pain.  Musculoskeletal:  Negative for back pain.  Skin:  Negative for rash.  BREAST: No symptoms   OBJECTIVE:   Vitals:  BP 106/70   Ht 5\' 4"  (1.626 m)   Wt 135 lb (61.2 kg)   LMP 09/29/2020 (Approximate)   BMI 23.17 kg/m   Physical Exam Vitals reviewed.  Constitutional:      Appearance: She is well-developed.  Pulmonary:     Effort: Pulmonary effort is normal.  Musculoskeletal:        General: Normal range of motion.     Cervical back: Normal range of motion.  Skin:    General: Skin is warm and dry.  Neurological:     General: No focal deficit present.  Mental Status: She is alert and oriented to person, place, and time.     Cranial Nerves: No cranial nerve deficit.  Psychiatric:        Mood and Affect: Mood normal.        Behavior: Behavior normal.        Thought Content: Thought content normal.        Judgment: Judgment normal.    Assessment/Plan: Secondary oligomenorrhea--on low dose OCPs. Neg UPTs. Reassurance. F/u prn. Offered thyroid testing but pt doesn't have any other sx and would like to follow expectantly for now. F/u prn.     Return if symptoms worsen or fail to improve.  Rykker Coviello B. Belen Zwahlen, PA-C 01/01/2021 4:59 PM

## 2021-01-01 ENCOUNTER — Ambulatory Visit (INDEPENDENT_AMBULATORY_CARE_PROVIDER_SITE_OTHER): Payer: BC Managed Care – PPO | Admitting: Obstetrics and Gynecology

## 2021-01-01 ENCOUNTER — Other Ambulatory Visit: Payer: Self-pay

## 2021-01-01 ENCOUNTER — Encounter: Payer: Self-pay | Admitting: Obstetrics and Gynecology

## 2021-01-01 VITALS — BP 106/70 | Ht 64.0 in | Wt 135.0 lb

## 2021-01-01 DIAGNOSIS — N914 Secondary oligomenorrhea: Secondary | ICD-10-CM

## 2021-01-01 NOTE — Patient Instructions (Signed)
I value your feedback and you entrusting us with your care. If you get a [facility] patient survey, I would appreciate you taking the time to let us know about your experience today. Thank you! ? ? ?

## 2021-06-08 ENCOUNTER — Telehealth: Payer: Self-pay

## 2021-06-08 DIAGNOSIS — Z3041 Encounter for surveillance of contraceptive pills: Secondary | ICD-10-CM

## 2021-06-08 MED ORDER — MICROGESTIN 24 FE 1-20 MG-MCG PO TABS: 1.0000 | ORAL_TABLET | Freq: Every day | ORAL | 1 refills | Status: DC

## 2021-06-08 NOTE — Telephone Encounter (Signed)
Pt calling; has misplaced two of three packs of bc; can she get 38m called in.  [phone]  Pt p/u the bc in [place]; has emptied out her purse; had her Dad to look for them as pt is in [place], [place]; adv I could call them in but ins probably won't pay for them; pt states that's fine.

## 2021-08-06 ENCOUNTER — Telehealth: Payer: Self-pay

## 2021-08-06 NOTE — Telephone Encounter (Signed)
Pt calling; has appt 4/10; wants to change bc brand; the one she has now makes her feel different.  [phone]  Adv pt this can be done at her appt since she has ~2wks left in her pack. ?

## 2021-08-07 ENCOUNTER — Telehealth: Payer: Self-pay

## 2021-08-07 ENCOUNTER — Ambulatory Visit: Payer: BC Managed Care – PPO | Admitting: Obstetrics and Gynecology

## 2021-08-07 MED ORDER — DROSPIRENONE-ETHINYL ESTRADIOL 3-0.03 MG PO TABS: 1.0000 | ORAL_TABLET | Freq: Every day | ORAL | 0 refills | Status: DC

## 2021-08-07 NOTE — Addendum Note (Signed)
Addended by: Althea Grimmer B on: 08/07/2021 04:20 PM ? ? Modules accepted: Orders ? ?

## 2021-08-07 NOTE — Telephone Encounter (Signed)
Pt calling; wants her next rx for bc to be a different brand.  [phone]  when I spoke c pt yesterday I was thinking the appt on 4/10 was for her annual but she isn't due for an annual until June; the appt on the 10th is to change the bc brand; pt is asking if she has to come in for the brand to be changed; adv to let me send it to [facility] and we will go from there. ?

## 2021-08-07 NOTE — Telephone Encounter (Signed)
Pt doesn't need 4/23 appt to change brand. What is reason to change brand? Does she want monthly period? ?

## 2021-08-07 NOTE — Telephone Encounter (Signed)
Different OCP eRxd. Start when due to start new pill pack. No lapse in contraception, may have BTB a pack or 2. F/u at 6/23 annual.

## 2021-08-07 NOTE — Telephone Encounter (Signed)
Called pt, no answer, [facility]. 

## 2021-08-08 NOTE — Telephone Encounter (Signed)
Pt aware.

## 2021-08-08 NOTE — Telephone Encounter (Signed)
Has been taken care of in a diff mychart msg. ?

## 2021-08-15 ENCOUNTER — Other Ambulatory Visit: Payer: Self-pay | Admitting: Obstetrics and Gynecology

## 2021-08-20 ENCOUNTER — Ambulatory Visit: Payer: BC Managed Care – PPO | Admitting: Obstetrics and Gynecology

## 2021-11-04 ENCOUNTER — Other Ambulatory Visit: Payer: Self-pay | Admitting: Obstetrics and Gynecology

## 2022-08-12 DIAGNOSIS — M6752 Plica syndrome, left knee: Secondary | ICD-10-CM | POA: Insufficient documentation

## 2022-09-19 ENCOUNTER — Telehealth: Payer: Self-pay | Admitting: Obstetrics and Gynecology

## 2022-09-19 NOTE — Telephone Encounter (Signed)
Left message for patient to call office to schedule annual appt with Helmut Muster

## 2022-09-24 NOTE — Telephone Encounter (Signed)
LM x 2 for patient to call office to schedule annual appt

## 2023-01-28 NOTE — Progress Notes (Unsigned)
PCP:  [name], MD   No chief complaint on file.    HPI:      [name] is a 22 y.o. No obstetric history on file. who LMP was No LMP recorded., presents today for her annual examination.  Her menses are regular every 28-30 days, lasting 4 days.  Dysmenorrhea minimal. She does not have intermenstrual bleeding. Has good cycle control with OCPs.  Sex activity:  currently active; contraception--OCPs. Last pap: 10/18/20 Results are ASCUS/neg HPV DNA; repeat pap due Hx of STDs: none Hx of vaginal eczema, sx improved.   There is no FH of breast cancer. There is no FH of ovarian cancer. The patient does self-breast exams.  Tobacco use: The patient denies current or previous tobacco use. Alcohol use: none No drug use.  Exercise: occas active  She does get adequate calcium and Vitamin D in her diet.  Gardasil vaccine completed.   Past Medical History:  Diagnosis Date   Eczema 11/10/2017   MRSA infection    Vaccine for human papilloma virus (HPV) types 6, 11, 16, and 18 administered     Past Surgical History:  Procedure Laterality Date   WISDOM TOOTH EXTRACTION      Family History  Problem Relation Age of Onset   Heart disease Maternal Grandmother    Hypertension Maternal Grandmother    Uterine cancer Paternal Grandmother        tested/treated   Heart disease Paternal Grandmother     Social History   Socioeconomic History   Marital status: Single    Spouse name: Not on file   Number of children: Not on file   Years of education: Not on file   Highest education level: Not on file  Occupational History   Not on file  Tobacco Use   Smoking status: Never   Smokeless tobacco: Never  Vaping Use   Vaping status: Never Used  Substance and Sexual Activity   Alcohol use: Yes   Drug use: No   Sexual activity: Yes    Birth control/protection: Pill  Other Topics Concern   Not on file  Social History Narrative   Not on file   Social Determinants of  Health   Financial Resource Strain: Not on file  Food Insecurity: Not on file  Transportation Needs: Not on file  Physical Activity: Not on file  Stress: Not on file  Social Connections: Unknown (06/28/2021)   Received from [facility], [facility]   Social Connections    Frequency of Communication with Friends and Family: Not asked    Frequency of Social Gatherings with Friends and Family: Not asked  Intimate Partner Violence: Unknown (06/28/2021)   Received from [facility], [facility]   Intimate Partner Violence    Fear of Current or Ex-Partner: Not asked    Emotionally Abused: Not asked    Physically Abused: Not asked    Sexually Abused: Not asked    No outpatient medications have been marked as taking for the 01/30/23 encounter (Appointment) with Tanaysha Alkins, Helmut Muster B, PA-C.     ROS:  Review of Systems  Constitutional:  Negative for fatigue, fever and unexpected weight change.  Respiratory:  Negative for cough, shortness of breath and wheezing.   Cardiovascular:  Negative for chest pain, palpitations and leg swelling.  Gastrointestinal:  Negative for blood in stool, constipation, diarrhea, nausea and vomiting.  Endocrine: Negative for cold intolerance, heat intolerance and polyuria.  Genitourinary:  Negative for dyspareunia, dysuria, flank pain,  frequency, genital sores, hematuria, menstrual problem, pelvic pain, urgency, vaginal bleeding, vaginal discharge and vaginal pain.  Musculoskeletal:  Negative for back pain, joint swelling and myalgias.  Skin:  Negative for rash.  Neurological:  Positive for headaches. Negative for dizziness, syncope, light-headedness and numbness.  Hematological:  Negative for adenopathy.  Psychiatric/Behavioral:  Negative for agitation, confusion, sleep disturbance and suicidal ideas. The patient is not nervous/anxious.      Objective: There were no vitals taken for this visit.   Physical Exam Constitutional:      Appearance: She is  well-developed.  Genitourinary:     Vulva normal.     Right Labia: No rash, tenderness or lesions.    Left Labia: No tenderness, lesions or rash.    No vaginal discharge, erythema or tenderness.      Right Adnexa: not tender and no mass present.    Left Adnexa: not tender and no mass present.    No cervical friability or polyp.     Uterus is not enlarged or tender.  Breasts:    Right: No mass, nipple discharge, skin change or tenderness.     Left: No mass, nipple discharge, skin change or tenderness.  Neck:     Thyroid: No thyromegaly.  Cardiovascular:     Rate and Rhythm: Normal rate and regular rhythm.     Heart sounds: Normal heart sounds. No murmur heard. Pulmonary:     Effort: Pulmonary effort is normal.     Breath sounds: Normal breath sounds.  Abdominal:     Palpations: Abdomen is soft.     Tenderness: There is no abdominal tenderness. There is no guarding or rebound.  Musculoskeletal:        General: Normal range of motion.     Cervical back: Normal range of motion.  Lymphadenopathy:     Cervical: No cervical adenopathy.  Neurological:     General: No focal deficit present.     Mental Status: She is alert and oriented to person, place, and time.     Cranial Nerves: No cranial nerve deficit.  Skin:    General: Skin is warm and dry.  Psychiatric:        Mood and Affect: Mood normal.        Behavior: Behavior normal.        Thought Content: Thought content normal.        Judgment: Judgment normal.  Vitals reviewed.    Assessment/Plan: Encounter for annual routine gynecological examination  Cervical cancer screening - Plan: Cytology - PAP  Screening for STD (sexually transmitted disease) - Plan: Cytology - PAP  Encounter for surveillance of contraceptive pills - Plan: Norethindrone Acetate-Ethinyl Estrad-FE (MICROGESTIN 24 FE) 1-20 MG-MCG(24) tablet; OCP RF   No orders of the defined types were placed in this encounter.            GYN counsel adequate  intake of calcium and vitamin D, diet and exercise     F/U  No follow-ups on file.  Daniya Aramburo B. Neriyah Cercone, PA-C 01/28/2023 12:32 PM

## 2023-01-30 ENCOUNTER — Ambulatory Visit (INDEPENDENT_AMBULATORY_CARE_PROVIDER_SITE_OTHER): Payer: BC Managed Care – PPO | Admitting: Obstetrics and Gynecology

## 2023-01-30 ENCOUNTER — Other Ambulatory Visit (HOSPITAL_COMMUNITY)
Admission: RE | Admit: 2023-01-30 | Discharge: 2023-01-30 | Disposition: A | Discharge status: Home / Self Care | Payer: BC Managed Care – PPO | Source: Ambulatory Visit | Attending: Obstetrics and Gynecology | Admitting: Obstetrics and Gynecology

## 2023-01-30 ENCOUNTER — Encounter: Payer: Self-pay | Admitting: Obstetrics and Gynecology

## 2023-01-30 VITALS — BP 113/76 | HR 105 | Ht 64.0 in | Wt 151.0 lb

## 2023-01-30 DIAGNOSIS — Z124 Encounter for screening for malignant neoplasm of cervix: Secondary | ICD-10-CM

## 2023-01-30 DIAGNOSIS — Z3041 Encounter for surveillance of contraceptive pills: Secondary | ICD-10-CM

## 2023-01-30 DIAGNOSIS — R87610 Atypical squamous cells of undetermined significance on cytologic smear of cervix (ASC-US): Secondary | ICD-10-CM

## 2023-01-30 DIAGNOSIS — Z01419 Encounter for gynecological examination (general) (routine) without abnormal findings: Secondary | ICD-10-CM | POA: Diagnosis not present

## 2023-01-30 DIAGNOSIS — Z113 Encounter for screening for infections with a predominantly sexual mode of transmission: Secondary | ICD-10-CM

## 2023-01-30 NOTE — Patient Instructions (Signed)
I value your feedback and you entrusting us with your care. If you get a [facility] patient survey, I would appreciate you taking the time to let us know about your experience today. Thank you! ? ? ?

## 2023-02-05 LAB — CYTOLOGY - PAP
Chlamydia: NEGATIVE
Comment: NEGATIVE
Comment: NORMAL
Diagnosis: NEGATIVE
Neisseria Gonorrhea: NEGATIVE

## 2023-02-10 ENCOUNTER — Emergency Department
Admission: EM | Admit: 2023-02-10 | Discharge: 2023-02-10 | Disposition: A | Discharge status: Home / Self Care | Payer: BC Managed Care – PPO | Attending: Emergency Medicine | Admitting: Emergency Medicine

## 2023-02-10 ENCOUNTER — Other Ambulatory Visit: Payer: Self-pay

## 2023-02-10 ENCOUNTER — Emergency Department: Payer: BC Managed Care – PPO

## 2023-02-10 DIAGNOSIS — R197 Diarrhea, unspecified: Secondary | ICD-10-CM | POA: Insufficient documentation

## 2023-02-10 DIAGNOSIS — R1011 Right upper quadrant pain: Secondary | ICD-10-CM | POA: Diagnosis present

## 2023-02-10 DIAGNOSIS — R110 Nausea: Secondary | ICD-10-CM | POA: Diagnosis not present

## 2023-02-10 LAB — CBC
HCT: 39.8 % (ref 36.0–46.0)
Hemoglobin: 13.1 g/dL (ref 12.0–15.0)
MCH: 28.6 pg (ref 26.0–34.0)
MCHC: 32.9 g/dL (ref 30.0–36.0)
MCV: 86.9 fL (ref 80.0–100.0)
Platelets: 285 10*3/uL (ref 150–400)
RBC: 4.58 MIL/uL (ref 3.87–5.11)
RDW: 12.7 % (ref 11.5–15.5)
WBC: 8.6 10*3/uL (ref 4.0–10.5)
nRBC: 0 % (ref 0.0–0.2)

## 2023-02-10 LAB — LIPASE, BLOOD: Lipase: 41 U/L (ref 11–51)

## 2023-02-10 LAB — URINALYSIS, ROUTINE W REFLEX MICROSCOPIC
Bilirubin Urine: NEGATIVE
Glucose, UA: NEGATIVE mg/dL
Ketones, ur: 5 mg/dL — AB
Leukocytes,Ua: NEGATIVE
Nitrite: NEGATIVE
Protein, ur: NEGATIVE mg/dL
Specific Gravity, Urine: 1.027 (ref 1.005–1.030)
pH: 5 (ref 5.0–8.0)

## 2023-02-10 LAB — COMPREHENSIVE METABOLIC PANEL
ALT: 18 U/L (ref 0–44)
AST: 20 U/L (ref 15–41)
Albumin: 4.3 g/dL (ref 3.5–5.0)
Alkaline Phosphatase: 63 U/L (ref 38–126)
Anion gap: 11 (ref 5–15)
BUN: 12 mg/dL (ref 6–20)
CO2: 21 mmol/L — ABNORMAL LOW (ref 22–32)
Calcium: 9.1 mg/dL (ref 8.9–10.3)
Chloride: 105 mmol/L (ref 98–111)
Creatinine, Ser: 0.78 mg/dL (ref 0.44–1.00)
GFR, Estimated: >60 mL/min (ref >60)
Glucose, Bld: 139 mg/dL — ABNORMAL HIGH (ref 70–99)
Potassium: 3.6 mmol/L (ref 3.5–5.1)
Sodium: 137 mmol/L (ref 135–145)
Total Bilirubin: 0.5 mg/dL (ref 0.3–1.2)
Total Protein: 7.9 g/dL (ref 6.5–8.1)

## 2023-02-10 LAB — POC URINE PREG, ED: Preg Test, Ur: NEGATIVE

## 2023-02-10 MED ORDER — PANTOPRAZOLE SODIUM 20 MG PO TBEC: 20.0000 mg | DELAYED_RELEASE_TABLET | Freq: Every day | ORAL | 0 refills | Status: DC

## 2023-02-10 NOTE — ED Provider Notes (Signed)
[facility] Provider Note    Event Date/Time   First MD Initiated Contact with Patient 02/10/23 1111     (approximate)   History   Abdominal Pain   HPI  Vanessa Scott is a 22 y.o. female   presents to the ED with complaint of abdominal pain for a couple of weeks with nausea and diarrhea.  Patient denies any fever, chills or urinary symptoms.  No known sick exposures.  Patient is unable to say that any particular foods set off her symptoms but seems to be worse at night when she is lying flat.  Patient does have a history of reflux symptoms for which she has in the past taken Tums.      Physical Exam   Triage Vital Signs: ED Triage Vitals  Encounter Vitals Group     BP 02/10/23 1052 112/81     Systolic BP Percentile --      Diastolic BP Percentile --      Pulse Rate 02/10/23 1052 (!) 106     Resp 02/10/23 1052 16     Temp 02/10/23 1052 98.8 F (37.1 C)     Temp src --      SpO2 02/10/23 1052 96 %     Weight 02/10/23 1051 150 lb (68 kg)     Height 02/10/23 1051 5\' 4"  (1.626 m)     Head Circumference --      Peak Flow --      Pain Score 02/10/23 1051 7     Pain Loc --      Pain Education --      Exclude from Growth Chart --     Most recent vital signs: Vitals:   02/10/23 1052 02/10/23 1455  BP: 112/81 117/78  Pulse: (!) 106 90  Resp: 16 16  Temp: 98.8 F (37.1 C) 98 F (36.7 C)  SpO2: 96% 96%     General: Awake, no distress.  CV:  Good peripheral perfusion.  Resp:  Normal effort.  Abd:  No distention.  Mild epigastric and right upper quadrant tenderness on palpation.  Bowel sounds normoactive x 4 quadrants. Other:     ED Results / Procedures / Treatments   Labs (all labs ordered are listed, but only abnormal results are displayed) Labs Reviewed  COMPREHENSIVE METABOLIC PANEL - Abnormal; Notable for the following components:      Result Value   CO2 21 (*)    Glucose, Bld 139 (*)    All other components within normal limits   URINALYSIS, ROUTINE W REFLEX MICROSCOPIC - Abnormal; Notable for the following components:   Color, Urine YELLOW (*)    APPearance HAZY (*)    Hgb urine dipstick SMALL (*)    Ketones, ur 5 (*)    Bacteria, UA RARE (*)    All other components within normal limits  POC URINE PREG, ED - Normal  LIPASE, BLOOD  CBC     RADIOLOGY  Ultrasound right upper quadrant abdomen per radiologist was positive for possible gallbladder sludge versus small gallstones.   PROCEDURES:  Critical Care performed:   Procedures   MEDICATIONS ORDERED IN ED: Medications - No data to display   IMPRESSION / MDM / ASSESSMENT AND PLAN / ED COURSE  I reviewed the triage vital signs and the nursing notes.   Differential diagnosis includes, but is not limited to, esophagitis, gastritis, pancreatitis, cholecystitis, cholelithiasis.  22 year old female presents to the ED with complaint of right upper quadrant pain  without definite food dyscrasias increasing her pain.  Patient does have a history of reflux and occasionally takes Tums.  She reports that her pain is worse at night then with eating.  Lab work was reassuring with unremarkable lab work.  Ultrasound as noted above with gallbladder sludge versus small gallstones.  Patient was made aware.  We will place patient on Protonix 20 mg daily and have her follow-up with GI for further workup if her reflux symptoms do not improve.  Patient also has an appointment with her PCP in November and is encouraged to keep this appointment.      Patient's presentation is most consistent with acute complicated illness / injury requiring diagnostic workup.  FINAL CLINICAL IMPRESSION(S) / ED DIAGNOSES   Final diagnoses:  Right upper quadrant abdominal pain     Rx / DC Orders   ED Discharge Orders          Ordered    pantoprazole (PROTONIX) 20 MG tablet  Daily        02/10/23 1446             Note:  This document was prepared using Dragon voice  recognition software and may include unintentional dictation errors.   Tommi Rumps, PA-C 02/10/23 1516    Minna Antis, MD 02/12/23 [phone]

## 2023-02-10 NOTE — ED Triage Notes (Signed)
Pt to ED for left sided abd pain for a couple weeks. +n/d. States concerned it is gallbladder.

## 2023-02-10 NOTE — Discharge Instructions (Signed)
Keep your appointment with your PCP in November. Avoid foods that are high in acid.  Prescription for Protonix was sent to the pharmacy for you begin taking once daily.  Also elevate the head of your bed which may help with symptoms at bedtime.

## 2023-02-17 ENCOUNTER — Telehealth: Payer: Self-pay | Admitting: Gastroenterology

## 2023-02-17 NOTE — Telephone Encounter (Signed)
The patient called to schedule office visit with Dr. Tobi Bastos. Patient was seen at the ER and they referred her to [place]. She is schedule with Dr. Tobi Bastos on 04/08/23 at 1:45 pm. I inform her of her $94 copay send out a new patient and added her to the waiting list.

## 2023-02-25 ENCOUNTER — Ambulatory Visit: Payer: BC Managed Care – PPO | Admitting: Gastroenterology

## 2023-02-25 ENCOUNTER — Encounter: Payer: Self-pay | Admitting: Gastroenterology

## 2023-02-25 VITALS — BP 109/75 | HR 84 | Temp 97.8°F | Ht 64.0 in | Wt 149.6 lb

## 2023-02-25 DIAGNOSIS — R10813 Right lower quadrant abdominal tenderness: Secondary | ICD-10-CM

## 2023-02-25 DIAGNOSIS — R1011 Right upper quadrant pain: Secondary | ICD-10-CM

## 2023-02-25 DIAGNOSIS — R1084 Generalized abdominal pain: Secondary | ICD-10-CM

## 2023-02-25 MED ORDER — OMEPRAZOLE 40 MG PO CPDR: 40.0000 mg | DELAYED_RELEASE_CAPSULE | Freq: Every day | ORAL | 0 refills | Status: DC

## 2023-02-25 NOTE — Progress Notes (Signed)
Vanessa Mood MD, MRCP(U.K) [address]  [address]  [place], [place] [address]  Main: [phone]  Fax: [phone]   Gastroenterology Consultation  Referring Provider:     Dana Allan, MD Primary Care Physician:  Vanessa Allan, MD Primary Gastroenterologist:  Dr. Wyline Scott  Reason for Consultation:    Abdominal pain.        HPI:   Vanessa Scott is a 22 y.o. y/o female referred for consultation & management  by Dr. Dana Allan, MD.     She was seen at the ER on 02/10/2023 with abdominal pain ongoing for a few weeks with nausea and diarrhea.She was commenced on Protonix 20 mg a day and asked to follow-up with GI.  Right upper quadrant ultrasound showed gallbladder sludge versus small gallstones.  CMP showed normal transaminases total bilirubin.  Lipase was not elevated.  Hemoglobin is 13.1 g.  Urine analysis showed small hemoglobin in the urine as well as rare bacteria.  She says that she has had right upper quadrant pain going on for a few months.  She wakes up with it lasts all day worse when she twists her body not affected by food intake not relieved by bowel movement.  She has a bowel movement daily which is porridge consistency but only once per day.  No other symptoms.  Some subjective weight loss she does not know why.  No family history of cancer family history of gallbladder issues requiring cholecystectomy.  She has been taking Advil on and off on a regular basis for headaches. Past Medical History:  Diagnosis Date   Eczema 11/10/2017   MRSA infection    Vaccine for human papilloma virus (HPV) types 6, 11, 16, and 18 administered     Past Surgical History:  Procedure Laterality Date   WISDOM TOOTH EXTRACTION      Prior to Admission medications   Medication Sig Start Date End Date Taking? Authorizing Provider  pantoprazole (PROTONIX) 20 MG tablet Take 1 tablet (20 mg total) by mouth daily. 02/10/23 02/10/24  Vanessa Rumps, PA-C    Family History  Problem  Relation Age of Onset   Heart disease Maternal Grandmother    Hypertension Maternal Grandmother    Uterine cancer Paternal Grandmother        tested/treated   Heart disease Paternal Grandmother      Social History   Tobacco Use   Smoking status: Never   Smokeless tobacco: Never  Vaping Use   Vaping status: Never Used  Substance Use Topics   Alcohol use: Yes    Comment: soc   Drug use: No    Allergies as of 02/25/2023 - Review Complete 02/25/2023  Allergen Reaction Noted   Penicillins Rash 04/25/2015   Penicillin g Rash 02/11/2019    Review of Systems:    All systems reviewed and negative except where noted in HPI.   Physical Exam:  BP 109/75   Pulse 84   Temp 97.8 F (36.6 C) (Oral)   Ht 5\' 4"  (1.626 m)   Wt 149 lb 9.6 oz (67.9 kg)   LMP 01/16/2023 (Exact Date)   BMI 25.68 kg/m  Patient's last menstrual period was 01/16/2023 (exact date). Psych:  Alert and cooperative. Normal Scott and affect. General:   Alert,  Well-developed, well-nourished, pleasant and cooperative in NAD Head:  Normocephalic and atraumatic. Eyes:  Sclera clear, no icterus.   Conjunctiva pink. Ears:  Normal auditory acuity. Neck:  Supple; no masses or thyromegaly. Tenderness over  the right lower ribs in the midclavicular and midaxillary line Abdomen:  Normal bowel sounds.  No bruits.  Soft, non-tender and non-distended without masses, hepatosplenomegaly or hernias noted.  No guarding or rebound tenderness.    Neurologic:  Alert and oriented x3;  grossly normal neurologically. Psych:  Alert and cooperative. Normal Scott and affect.  Imaging Studies: US Abdomen Limited RUQ (LIVER/GB)  Result Date: 02/10/2023 CLINICAL DATA:  Right upper quadrant pain for 3 weeks. EXAM: ULTRASOUND ABDOMEN LIMITED RIGHT UPPER QUADRANT COMPARISON:  None Available. FINDINGS: Gallbladder: Gallbladder sludge versus small gallstones are noted. The gallbladder appears contracted no gallbladder wall thickening visualized.  No sonographic Murphy sign noted by sonographer. Common bile duct: Diameter: 3 mm Liver: No focal lesion identified. Within normal limits in parenchymal echogenicity. Portal vein is patent on color Doppler imaging with normal direction of blood flow towards the liver. Other: None. IMPRESSION: Gallbladder sludge versus small gallstones. Electronically Signed   By: Romona Curls M.D.   On: 02/10/2023 14:38    Assessment and Plan:   Vanessa Scott is a 22 y.o. y/o female has been referred for right quadrant pain seen at the ER on 02/10/2023 workup showed only sludge in the gallbladder.  The nature of her pain that she describes is more suggestive of musculoskeletal.  Does not have a biliary component in it that is typical.  She does have tenderness over her right lower ribs which again goes more in favor of a musculoskeletal pain.  She has been taking Advil on a regular basis for headaches which may be causing some colitis versus gastritis stop   Plan 1.  NSAID use 2.  As needed Tylenol for back pain 3.  Consider physical therapy for back pain 4.  Celiac serology, H. pylori breath test, trial of PPI 5.  At follow-up if no better could consider a HIDA scan. 6.  She has a follow-up coming up with a new primary care physician advised her to discuss about issues with weight loss if all other issues have been ruled out then we would have to consider endoscopy evaluation   Follow up in 6 to 8 weeks with Vanessa Scott  Dr Vanessa Mood MD,MRCP(U.K)

## 2023-02-25 NOTE — Patient Instructions (Addendum)
Please stop taking any type of NSAIDS (Aspirin, Naproxen, Ibuprofen, Advil).

## 2023-02-27 LAB — CELIAC DISEASE AB SCREEN W/RFX
Antigliadin Abs, IgA: 3 U (ref 0–19)
IgA/Immunoglobulin A, Serum: 190 mg/dL (ref 87–352)

## 2023-02-27 LAB — H. PYLORI BREATH TEST: H pylori Breath Test: NEGATIVE

## 2023-03-28 ENCOUNTER — Other Ambulatory Visit: Payer: Self-pay

## 2023-03-28 ENCOUNTER — Emergency Department: Payer: BC Managed Care – PPO

## 2023-03-28 ENCOUNTER — Encounter: Payer: Self-pay | Admitting: Emergency Medicine

## 2023-03-28 ENCOUNTER — Emergency Department
Admission: EM | Admit: 2023-03-28 | Discharge: 2023-03-28 | Disposition: A | Discharge status: Home / Self Care | Payer: BC Managed Care – PPO | Attending: Emergency Medicine | Admitting: Emergency Medicine

## 2023-03-28 DIAGNOSIS — R1010 Upper abdominal pain, unspecified: Secondary | ICD-10-CM

## 2023-03-28 DIAGNOSIS — R1011 Right upper quadrant pain: Secondary | ICD-10-CM | POA: Diagnosis present

## 2023-03-28 DIAGNOSIS — K8070 Calculus of gallbladder and bile duct without cholecystitis without obstruction: Secondary | ICD-10-CM | POA: Insufficient documentation

## 2023-03-28 DIAGNOSIS — K8020 Calculus of gallbladder without cholecystitis without obstruction: Secondary | ICD-10-CM

## 2023-03-28 DIAGNOSIS — K8050 Calculus of bile duct without cholangitis or cholecystitis without obstruction: Secondary | ICD-10-CM

## 2023-03-28 LAB — URINALYSIS, ROUTINE W REFLEX MICROSCOPIC
Bilirubin Urine: NEGATIVE
Glucose, UA: NEGATIVE mg/dL
Hgb urine dipstick: NEGATIVE
Ketones, ur: NEGATIVE mg/dL
Leukocytes,Ua: NEGATIVE
Nitrite: NEGATIVE
Protein, ur: NEGATIVE mg/dL
Specific Gravity, Urine: 1.03 (ref 1.005–1.030)
pH: 5 (ref 5.0–8.0)

## 2023-03-28 LAB — COMPREHENSIVE METABOLIC PANEL
ALT: 16 U/L (ref 0–44)
AST: 21 U/L (ref 15–41)
Albumin: 4.4 g/dL (ref 3.5–5.0)
Alkaline Phosphatase: 58 U/L (ref 38–126)
Anion gap: 10 (ref 5–15)
BUN: 13 mg/dL (ref 6–20)
CO2: 23 mmol/L (ref 22–32)
Calcium: 9.2 mg/dL (ref 8.9–10.3)
Chloride: 106 mmol/L (ref 98–111)
Creatinine, Ser: 0.68 mg/dL (ref 0.44–1.00)
GFR, Estimated: >60 mL/min (ref >60)
Glucose, Bld: 114 mg/dL — ABNORMAL HIGH (ref 70–99)
Potassium: 4 mmol/L (ref 3.5–5.1)
Sodium: 139 mmol/L (ref 135–145)
Total Bilirubin: 0.3 mg/dL (ref <1.2)
Total Protein: 7.6 g/dL (ref 6.5–8.1)

## 2023-03-28 LAB — CBC WITH DIFFERENTIAL/PLATELET
Abs Immature Granulocytes: 0.02 10*3/uL (ref 0.00–0.07)
Basophils Absolute: 0 10*3/uL (ref 0.0–0.1)
Basophils Relative: 0 %
Eosinophils Absolute: 0.1 10*3/uL (ref 0.0–0.5)
Eosinophils Relative: 1 %
HCT: 40.5 % (ref 36.0–46.0)
Hemoglobin: 13.3 g/dL (ref 12.0–15.0)
Immature Granulocytes: 0 %
Lymphocytes Relative: 33 %
Lymphs Abs: 3.3 10*3/uL (ref 0.7–4.0)
MCH: 29.2 pg (ref 26.0–34.0)
MCHC: 32.8 g/dL (ref 30.0–36.0)
MCV: 88.8 fL (ref 80.0–100.0)
Monocytes Absolute: 0.8 10*3/uL (ref 0.1–1.0)
Monocytes Relative: 8 %
Neutro Abs: 5.9 10*3/uL (ref 1.7–7.7)
Neutrophils Relative %: 58 %
Platelets: 290 10*3/uL (ref 150–400)
RBC: 4.56 MIL/uL (ref 3.87–5.11)
RDW: 13 % (ref 11.5–15.5)
WBC: 10.2 10*3/uL (ref 4.0–10.5)
nRBC: 0 % (ref 0.0–0.2)

## 2023-03-28 LAB — POC URINE PREG, ED: Preg Test, Ur: NEGATIVE

## 2023-03-28 LAB — LIPASE, BLOOD: Lipase: 44 U/L (ref 11–51)

## 2023-03-28 MED ORDER — ONDANSETRON 4 MG PO TBDP
4.0000 mg | ORAL_TABLET | Freq: Once | ORAL | Status: AC
  Administered 2023-03-28: 4 mg via ORAL
  Filled 2023-03-28: qty 1

## 2023-03-28 MED ORDER — ONDANSETRON 4 MG PO TBDP: 4.0000 mg | ORAL_TABLET | Freq: Three times a day (TID) | ORAL | 0 refills | Status: DC | PRN

## 2023-03-28 MED ORDER — OXYCODONE-ACETAMINOPHEN 5-325 MG PO TABS: 1.0000 | ORAL_TABLET | ORAL | 0 refills | Status: DC | PRN

## 2023-03-28 MED ORDER — OXYCODONE-ACETAMINOPHEN 5-325 MG PO TABS
1.0000 | ORAL_TABLET | Freq: Once | ORAL | Status: AC
  Administered 2023-03-28: 1 via ORAL
  Filled 2023-03-28: qty 1

## 2023-03-28 NOTE — ED Provider Notes (Signed)
[facility] Provider Note    Event Date/Time   First MD Initiated Contact with Patient 03/28/23 [phone]     (approximate)   History   Abdominal Pain   HPI  Vanessa Scott is a 21 y.o. female who presents to the ED from home with a chief complaint of abdominal pain.  Patient awoke at 1:45 AM with right upper abdominal pain radiating into the back.  Denies associated fever/chills, chest pain, shortness of breath, nausea, vomiting or dizziness.  Seen for similar symptoms in September and thought to have gallstones.     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 Authorizing Provider  ondansetron (ZOFRAN-ODT) 4 MG disintegrating tablet Take 1 tablet (4 mg total) by mouth every 8 (eight) hours as needed for nausea or vomiting. 03/28/23  Yes Irean Hong, MD  oxyCODONE-acetaminophen (PERCOCET/ROXICET) 5-325 MG tablet Take 1 tablet by mouth every 4 (four) hours as needed for severe pain (pain score 7-10). 03/28/23  Yes Irean Hong, MD  omeprazole (PRILOSEC) 40 MG capsule Take 1 capsule (40 mg total) by mouth daily. 02/25/23   Wyline Mood, MD  pantoprazole (PROTONIX) 20 MG tablet Take 1 tablet (20 mg total) by mouth daily. Patient not taking: Reported on 02/25/2023 02/10/23 02/10/24  Tommi Rumps, PA-C     Allergies  Penicillins and Penicillin g   Family History   Family History  Problem Relation Age of Onset   Heart disease Maternal Grandmother     Hypertension Maternal Grandmother    Uterine cancer Paternal Grandmother        tested/treated   Heart disease Paternal Grandmother      Physical Exam  Triage Vital Signs: ED Triage Vitals [03/28/23 0310]  Encounter Vitals Group     BP 116/80     Systolic BP Percentile      Diastolic BP Percentile      Pulse Rate 99     Resp 19     Temp 97.6 F (36.4 C)     Temp Source Oral     SpO2 99 %     Weight 147 lb (66.7 kg)     Height 5\' 4"  (1.626 m)     Head Circumference      Peak Flow      Pain Score 9     Pain Loc      Pain Education      Exclude from Growth Chart     Updated Vital Signs: BP 116/80 (BP Location: Left Arm)   Pulse 99   Temp 97.6 F (36.4 C) (Oral)   Resp 19   Ht 5\' 4"  (1.626 m)   Wt 66.7 kg   LMP 03/15/2023 (Exact Date)   SpO2 99%   BMI 25.23 kg/m    General: Awake, no distress.  CV:  RRR.  Good peripheral perfusion.  Resp:  Normal effort.  CTAB. Abd:  Mild tenderness to palpation right upper quadrant without rebound or guarding.  No distention.  Other:  No truncal vesicles.   ED Results / Procedures / Treatments  Labs (all labs ordered are listed, but only abnormal results are displayed) Labs Reviewed  COMPREHENSIVE METABOLIC PANEL - Abnormal; Notable for the following components:      Result Value   Glucose, Bld 114 (*)    All other components within normal limits  URINALYSIS, ROUTINE W REFLEX MICROSCOPIC - Abnormal; Notable for the following components:   Color, Urine YELLOW (*)    APPearance HAZY (*)    All other components within normal limits  CBC WITH DIFFERENTIAL/PLATELET  LIPASE, BLOOD  POC URINE PREG, ED     EKG  None   RADIOLOGY I have independently visualized and interpreted patient's ultrasound as well as noted the radiology interpretation:  Ultrasound: Cholelithiasis without cholecystitis  Official radiology report(s): US ABDOMEN LIMITED RUQ (LIVER/GB)  Result Date: 03/28/2023 CLINICAL DATA:  Upper abdominal  pain. EXAM: ULTRASOUND ABDOMEN LIMITED RIGHT UPPER QUADRANT COMPARISON:  Right upper quadrant ultrasound 02/10/2023 FINDINGS: Gallbladder: There are multiple tiny stones layering dependently in the gallbladder intermixed with sludge. There is no gallbladder wall thickening, pericholecystic fluid, impacted stones or positive sonographic Murphy's sign. Common bile duct: Diameter: 3 mm.  No intrahepatic biliary prominence. Liver: No focal lesion identified. Within normal limits in parenchymal echogenicity. Portal vein is patent on color Doppler imaging with normal direction of blood flow towards the liver. Other: None. IMPRESSION: Cholelithiasis without sonographic findings of acute cholecystitis. Electronically Signed   By: Almira Bar M.D.   On: 03/28/2023 04:51     PROCEDURES:  Critical Care performed: No  Procedures   MEDICATIONS ORDERED IN ED: Medications  oxyCODONE-acetaminophen (PERCOCET/ROXICET) 5-325 MG per tablet 1 tablet (has no administration in time range)  ondansetron (ZOFRAN-ODT) disintegrating tablet 4 mg (has no administration in time range)     IMPRESSION / MDM / ASSESSMENT AND PLAN / ED COURSE  I reviewed the triage vital signs and the nursing notes.                             22 year old female presenting with upper abdominal pain. Differential diagnosis includes, but is not limited to, biliary disease (biliary colic, acute cholecystitis, cholangitis, choledocholithiasis, etc), intrathoracic causes for epigastric abdominal pain including ACS, gastritis, duodenitis, pancreatitis, small bowel or large bowel obstruction, abdominal aortic aneurysm, hernia, and ulcer(s).  I personally reviewed patient's records and note a GI visit from 02/25/2023 for right upper quadrant abdominal pain.  Patient's presentation is most consistent with acute complicated illness / injury requiring diagnostic workup.  Laboratory results demonstrate normal WBC 10.2, unremarkable LFTs/lipase.   Ultrasound demonstrates cholelithiasis without acute cholecystitis.  Without intervention, patient's pain has decreased from 10/10 to 4/10.  Will administer oral Percocet and Zofran now and discharge home with as needed prescriptions for same.  Patient will follow-up with general surgery next week.  Strict return precautions given.  Patient and family member verbalized understanding and agree with plan of care.      FINAL CLINICAL IMPRESSION(S) / ED DIAGNOSES   Final diagnoses:  Pain of upper abdomen  Calculus of gallbladder without cholecystitis without obstruction  Biliary colic     Rx / DC Orders   ED Discharge Orders          Ordered    oxyCODONE-acetaminophen (PERCOCET/ROXICET) 5-325 MG tablet  Every 4  hours PRN        03/28/23 0459    ondansetron (ZOFRAN-ODT) 4 MG disintegrating tablet  Every 8 hours PRN        03/28/23 0459             Note:  This document was prepared using Dragon voice recognition software and may include unintentional dictation errors.   Irean Hong, MD 03/28/23 [phone]

## 2023-03-28 NOTE — Discharge Instructions (Signed)
1. Take medicines as needed for pain & nausea (Percocet/Zofran #30). 2. Clear liquids x 12 hours, then bland diet x 1 week, then slowly advance diet as tolerated. Avoid fatty, greasy, spicy foods and drinks. 3. Return to the ER for worsening symptoms, persistent vomiting, fever, difficulty breathing or other concerns.  

## 2023-03-28 NOTE — ED Triage Notes (Signed)
Patient ambulatory to triage with steady gait, without difficulty or distress noted; pt reports being seen approx mo ago and dx with gallstones; awoke at 145am with rt sided abd pain radiating into back; denies any accomp symptoms

## 2023-04-01 ENCOUNTER — Ambulatory Visit: Payer: BC Managed Care – PPO | Admitting: Family Medicine

## 2023-04-01 VITALS — BP 93/62 | HR 98 | Temp 98.2°F | Resp 16 | Ht 64.0 in | Wt 147.4 lb

## 2023-04-01 DIAGNOSIS — K8020 Calculus of gallbladder without cholecystitis without obstruction: Secondary | ICD-10-CM

## 2023-04-01 DIAGNOSIS — Z1329 Encounter for screening for other suspected endocrine disorder: Secondary | ICD-10-CM

## 2023-04-01 DIAGNOSIS — D649 Anemia, unspecified: Secondary | ICD-10-CM

## 2023-04-01 DIAGNOSIS — F39 Unspecified mood [affective] disorder: Secondary | ICD-10-CM | POA: Diagnosis not present

## 2023-04-01 DIAGNOSIS — R634 Abnormal weight loss: Secondary | ICD-10-CM | POA: Diagnosis not present

## 2023-04-01 DIAGNOSIS — R7309 Other abnormal glucose: Secondary | ICD-10-CM

## 2023-04-01 DIAGNOSIS — E559 Vitamin D deficiency, unspecified: Secondary | ICD-10-CM

## 2023-04-01 DIAGNOSIS — Z13220 Encounter for screening for lipoid disorders: Secondary | ICD-10-CM

## 2023-04-01 DIAGNOSIS — Z114 Encounter for screening for human immunodeficiency virus [HIV]: Secondary | ICD-10-CM

## 2023-04-01 DIAGNOSIS — E538 Deficiency of other specified B group vitamins: Secondary | ICD-10-CM

## 2023-04-01 DIAGNOSIS — Z1159 Encounter for screening for other viral diseases: Secondary | ICD-10-CM

## 2023-04-01 NOTE — Patient Instructions (Signed)
It was a pleasure meeting you today. Thank you for allowing me to take part in your health care.  Our goals for today as we discussed include:  Schedule fasting lab appointment.  Fast for 10 hours  Consider referral to [facility] for evaluation and counseling.  [facility]  [address] [place] [address] [phone]    [email or web address] [address], [place] [phone]   For Mental Health Concerns  [facility] [phone] Address: [address] [place], [place] [address] Hours: Open 24/7, No appointment required.     This is a list of the screening recommended for you and due dates:  Health Maintenance  Topic Date Due   HPV Vaccine (1 - 3-dose series) Never done   HIV Screening  Never done   Hepatitis C Screening  Never done   COVID-19 Vaccine (1 - 2023-24 season) 05/13/2023*   Chlamydia screening  01/30/2024   Pap Smear  01/29/2026   DTaP/Tdap/Td vaccine (3 - Td or Tdap) 01/29/2032   Flu Shot  Completed  *Topic was postponed. The date shown is not the original due date.      If you have any questions or concerns, please do not hesitate to call the office at [phone].  I look forward to our next visit and until then take care and stay safe.  Regards,   Dana Allan, MD   [facility]

## 2023-04-01 NOTE — Progress Notes (Signed)
SUBJECTIVE:   Chief Complaint  Patient presents with   Establish Care   HPI Presents to clinic to establish care  Discussed the use of AI scribe software for clinical note transcription with the patient, who gave verbal consent to proceed.  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 She reports a recent diagnosis of gallstones, for which she has an upcoming surgical consultation. She has experienced severe pain related to this condition, leading to two ER visits. The patient was prescribed Zofran and Percocet for symptom management, but she has not been regularly taking these medications, opting to use them only as needed.  The patient also reports a history of anxiety, which is not formally diagnosed or medicated. She denies any other significant medical history, including high blood pressure, diabetes, thyroid issues, heart problems, lung problems, or other gastrointestinal issues. She has no history of joint problems or menstrual irregularities.  The patient's surgical history is limited to wisdom teeth extraction in 2020, with no reported issues with general anesthesia. Family history is notable for a grandmother on the paternal side who was diagnosed with either uterine or ovarian cancer during the patient's high school years.  The patient denies any tobacco use, vaping, or illicit drug use. She reports social alcohol consumption and heterosexual activity, with consistent condom use and no history of sexually transmitted infections. She has experienced a recent weight loss of approximately 15 pounds, which was not intentional and is possibly related to stress.  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 She has not been fasting and will need to return for blood work. The patient has declined a referral to [facility] for anxiety  management at this time.    PERTINENT PMH / PSH: As above  OBJECTIVE:  BP 93/62   Pulse 98   Temp 98.2 F (36.8 C)   Resp 16   Ht 5\' 4"  (1.626 m)   Wt 147 lb 6 oz (66.8 kg)   LMP 03/15/2023 (Exact Date)   SpO2 98%   BMI 25.30 kg/m    Physical Exam Vitals reviewed.  Constitutional:      General: She is not in acute distress.    Appearance: She is not ill-appearing.  HENT:     Head: Normocephalic.     Right Ear: Tympanic membrane, ear canal and external ear normal.     Left Ear: Tympanic membrane, ear canal and external ear normal.     Nose: Nose normal.     Mouth/Throat:     Mouth: Mucous membranes are moist.  Eyes:     Extraocular Movements: Extraocular movements intact.     Conjunctiva/sclera: Conjunctivae normal.     Pupils: Pupils are equal, round, and reactive to light.  Neck:     Thyroid: No thyromegaly or thyroid tenderness.     Vascular: No carotid bruit.  Cardiovascular:     Rate and Rhythm: Normal rate and regular rhythm.     Pulses: Normal pulses.     Heart sounds: Normal heart sounds.  Pulmonary:     Effort: Pulmonary effort is normal.     Breath sounds: Normal breath sounds.  Abdominal:     General: Bowel sounds are normal. There is no distension.     Palpations: Abdomen is soft.     Tenderness: There is no abdominal tenderness. There is no right CVA tenderness, left CVA tenderness,  guarding or rebound.  Musculoskeletal:        General: Normal range of motion.     Cervical back: Normal range of motion.     Right lower leg: No edema.     Left lower leg: No edema.  Lymphadenopathy:     Cervical: No cervical adenopathy.  Skin:    Capillary Refill: Capillary refill takes less than 2 seconds.  Neurological:     General: No focal deficit present.     Mental Status: She is alert and oriented to person, place, and time. Mental status is at baseline.     Motor: No weakness.  Psychiatric:        Mood and Affect: Mood normal.        Behavior: Behavior  normal.        Thought Content: Thought content normal.        Judgment: Judgment normal.        04/01/2023    2:09 PM  Depression screen PHQ 2/9  Decreased Interest 1  Down, Depressed, Hopeless 0  PHQ - 2 Score 1  Altered sleeping 2  Tired, decreased energy 3  Change in appetite 2  Feeling bad or failure about yourself  0  Trouble concentrating 3  Moving slowly or fidgety/restless 0  Suicidal thoughts 0  PHQ-9 Score 11  Difficult doing work/chores Somewhat difficult      04/01/2023    2:09 PM  GAD 7 : Generalized Anxiety Score  Nervous, Anxious, on Edge 3  Control/stop worrying 2  Worry too much - different things 1  Trouble relaxing 3  Restless 2  Easily annoyed or irritable 2  Afraid - awful might happen 2  Total GAD 7 Score 15  Anxiety Difficulty Very difficult    ASSESSMENT/PLAN:  Calculus of gallbladder without cholecystitis without obstruction Assessment & Plan: Scheduled for surgical consult for gallbladder removal due to recurrent pain episodes. Currently managing pain with Percocet as needed and Zofran for nausea. -Continue current management plan until surgical consult. -Avoid fatty foods to prevent gallbladder contraction and associated pain.   Mood disorder (HCC) Assessment & Plan: Self-reported anxiety with high scores on anxiety and depression scales. No current treatment plan in place. -Order comprehensive lab work including thyroid function tests, vitamin D, vitamin B12, and hemoglobin to rule out physiological causes of anxiety symptoms. -Provide information on [facility], [facility], and [email or web address] for potential future therapy resources. -Patient not interested in formal evaluation or medication at this time and reports well controlled.   Unintentional weight loss Assessment & Plan: Recent loss of 15 pounds without intentional dieting or exercise changes. Possibly from mood disorder or recent gallbladder issues. -Investigate  potential causes through comprehensive lab work. -Recent CBC, Cmet, Lipase reassuring.  UPT negative   Orders: -     Hemoglobin A1c; Future -     TSH; Future  Need for hepatitis C screening test -     Hepatitis C antibody; Future  Encounter for screening for HIV -     HIV Antibody (routine testing w rflx); Future  Vitamin D deficiency -     VITAMIN D 25 Hydroxy (Vit-D Deficiency, Fractures); Future -     Vitamin D (Ergocalciferol); Take 1 capsule (50,000 Units total) by mouth every 7 (seven) days.  Dispense: 12 capsule; Refill: 1  Vitamin B 12 deficiency -     Vitamin B12; Future  Lipid screening -     Lipid panel; Future  Abnormal glucose -  Hemoglobin A1c; Future   PDMP reviewed  Return if symptoms worsen or fail to improve, for PCP.  Dana Allan, MD

## 2023-04-03 ENCOUNTER — Encounter: Payer: Self-pay | Admitting: General Surgery

## 2023-04-03 ENCOUNTER — Ambulatory Visit: Payer: BC Managed Care – PPO | Admitting: General Surgery

## 2023-04-03 ENCOUNTER — Telehealth: Payer: Self-pay | Admitting: General Surgery

## 2023-04-03 ENCOUNTER — Ambulatory Visit: Payer: Self-pay | Admitting: General Surgery

## 2023-04-03 VITALS — BP 117/74 | HR 102 | Temp 98.0°F | Ht 64.0 in | Wt 147.0 lb

## 2023-04-03 DIAGNOSIS — K8020 Calculus of gallbladder without cholecystitis without obstruction: Secondary | ICD-10-CM | POA: Diagnosis not present

## 2023-04-03 DIAGNOSIS — K8050 Calculus of bile duct without cholangitis or cholecystitis without obstruction: Secondary | ICD-10-CM

## 2023-04-03 NOTE — Telephone Encounter (Signed)
Left message for patient to call, please inform her of the following regarding scheduled surgery with Dr. Maurine Minister.   Pre-Admission date/time, and Surgery date at [facility].  Surgery Date: 04/21/23 Preadmission Testing Date: 04/15/23 (phone 8a-1p)  Also patient will need to call at [phone], between 1-3:00pm the day before surgery, to find out what time to arrive for surgery.

## 2023-04-03 NOTE — Patient Instructions (Addendum)
You have requested to have your gallbladder removed. This will be done at [facility] with Dr. Maurine Minister on December 9th.  You will most likely be out of work 1-2 weeks for this surgery.  If you have FMLA or disability paperwork that needs filled out you may drop this off at our office or this can be faxed to [phone].  You will return after your post-op appointment with a lifting restriction for approximately 4 more weeks.  You will be able to eat anything you would like to following surgery. But, start by eating a bland diet and advance this as tolerated. The Gallbladder diet is below, please go as closely by this diet as possible prior to surgery to avoid any further attacks.  Please see the (blue)pre-care form that you have been given today. Our surgery scheduler will call you to verify surgery date and to go over information.   If you have any questions, please call our office.  Laparoscopic Cholecystectomy Laparoscopic cholecystectomy is surgery to remove the gallbladder. The gallbladder is located in the upper right part of the abdomen, behind the liver. It is a storage sac for bile, which is produced in the liver. Bile aids in the digestion and absorption of fats. Cholecystectomy is often done for inflammation of the gallbladder (cholecystitis). This condition is usually caused by a buildup of gallstones (cholelithiasis) in the gallbladder. Gallstones can block the flow of bile, and that can result in inflammation and pain. In severe cases, emergency surgery may be required. If emergency surgery is not required, you will have time to prepare for the procedure. Laparoscopic surgery is an alternative to open surgery. Laparoscopic surgery has a shorter recovery time. Your common bile duct may also need to be examined during the procedure. If stones are found in the common bile duct, they may be removed. LET [facility] CARE PROVIDER KNOW ABOUT: Any allergies you have. All medicines  you are taking, including vitamins, herbs, eye drops, creams, and over-the-counter medicines. Previous problems you or members of your family have had with the use of anesthetics. Any blood disorders you have. Previous surgeries you have had.  Any medical conditions you have. RISKS AND COMPLICATIONS Generally, this is a safe procedure. However, problems may occur, including: Infection. Bleeding. Allergic reactions to medicines. Damage to other structures or organs. A stone remaining in the common bile duct. A bile leak from the cyst duct that is clipped when your gallbladder is removed. The need to convert to open surgery, which requires a larger incision in the abdomen. This may be necessary if your surgeon thinks that it is not safe to continue with a laparoscopic procedure. BEFORE THE PROCEDURE Ask your health care provider about: Changing or stopping your regular medicines. This is especially important if you are taking diabetes medicines or blood thinners. Taking medicines such as aspirin and ibuprofen. These medicines can thin your blood. Do not take these medicines before your procedure if your health care provider instructs you not to. Follow instructions from your health care provider about eating or drinking restrictions. Let your health care provider know if you develop a cold or an infection before surgery. Plan to have someone take you home after the procedure. Ask your health care provider how your surgical site will be marked or identified. You may be given antibiotic medicine to help prevent infection. PROCEDURE To reduce your risk of infection: Your health care team will wash or sanitize their hands. Your skin will be washed with  soap. An IV tube may be inserted into one of your veins. You will be given a medicine to make you fall asleep (general anesthetic). A breathing tube will be placed in your mouth. The surgeon will make several small cuts (incisions) in your  abdomen. A thin, lighted tube (laparoscope) that has a tiny camera on the end will be inserted through one of the small incisions. The camera on the laparoscope will send a picture to a TV screen (monitor) in the operating room. This will give the surgeon a good view inside your abdomen. A gas will be pumped into your abdomen. This will expand your abdomen to give the surgeon more room to perform the surgery. Other tools that are needed for the procedure will be inserted through the other incisions. The gallbladder will be removed through one of the incisions. After your gallbladder has been removed, the incisions will be closed with stitches (sutures), staples, or skin glue. Your incisions may be covered with a bandage (dressing). The procedure may vary among health care providers and hospitals. AFTER THE PROCEDURE Your blood pressure, heart rate, breathing rate, and blood oxygen level will be monitored often until the medicines you were given have worn off. You will be given medicines as needed to control your pain.   This information is not intended to replace advice given to you by your health care provider. Make sure you discuss any questions you have with your health care provider.   Document Released: 04/29/2005 Document Revised: 01/18/2015 Document Reviewed: 12/09/2012 Elsevier Interactive Patient Education 2016 Elsevier Inc.   Low-Fat Diet for Gallbladder Conditions A low-fat diet can be helpful if you have pancreatitis or a gallbladder condition. With these conditions, your pancreas and gallbladder have trouble digesting fats. A healthy eating plan with less fat will help rest your pancreas and gallbladder and reduce your symptoms. WHAT DO I NEED TO KNOW ABOUT THIS DIET? Eat a low-fat diet. Reduce your fat intake to less than 20-30% of your total daily calories. This is less than 50-60 g of fat per day. Remember that you need some fat in your diet. Ask your dietician what your daily  goal should be. Choose nonfat and low-fat healthy foods. Look for the words "nonfat," "low fat," or "fat free." As a guide, look on the label and choose foods with less than 3 g of fat per serving. Eat only one serving. Avoid alcohol. Do not smoke. If you need help quitting, talk with your health care provider. Eat small frequent meals instead of three large heavy meals. WHAT FOODS CAN I EAT? Grains Include healthy grains and starches such as potatoes, wheat bread, fiber-rich cereal, and brown rice. Choose whole grain options whenever possible. In adults, whole grains should account for 45-65% of your daily calories.  Fruits and Vegetables Eat plenty of fruits and vegetables. Fresh fruits and vegetables add fiber to your diet. Meats and Other Protein Sources Eat lean meat such as chicken and pork. Trim any fat off of meat before cooking it. Eggs, fish, and beans are other sources of protein. In adults, these foods should account for 10-35% of your daily calories. Dairy Choose low-fat milk and dairy options. Dairy includes fat and protein, as well as calcium.  Fats and Oils Limit high-fat foods such as fried foods, sweets, baked goods, sugary drinks.  Other Creamy sauces and condiments, such as mayonnaise, can add extra fat. Think about whether or not you need to use them, or use smaller amounts or  low fat options. WHAT FOODS ARE NOT RECOMMENDED? High fat foods, such as: [employer]. Ice cream. [place] toast. Sweet rolls. Pizza. Cheese bread. Foods covered with batter, butter, creamy sauces, or cheese. Fried foods. Sugary drinks and desserts. Foods that cause gas or bloating   This information is not intended to replace advice given to you by your health care provider. Make sure you discuss any questions you have with your health care provider.   Document Released: 05/04/2013 Document Reviewed: 05/04/2013 Elsevier Interactive Patient Education [employer]

## 2023-04-03 NOTE — Progress Notes (Signed)
Patient ID: Vanessa Scott, female   DOB: 2000-12-23, 22 y.o.   MRN: [id number] CC: Symptomatic Cholelithiasis  History of Present Illness Vanessa Scott is a 22 y.o. female with no significant past medical history that presents to our office in consultation for symptomatic cholelithiasis.  The patient reports that over the last several months she has had right upper quadrant and epigastric abdominal pain after eating.  She says it is worse with spicy and fatty foods.  She denies any nausea or vomiting when she has these episodes.  She reports that the pain last for a couple of hours had a sharpness but then she will have dull pain over the next several days.  She has an ultrasound that shows cholelithiasis done at an ED visit for the same problem.  Past Medical History Past Medical History:  Diagnosis Date   Anxiety    Eczema 11/10/2017   MRSA infection    Vaccine for human papilloma virus (HPV) types 6, 11, 16, and 18 administered        Past Surgical History:  Procedure Laterality Date   WISDOM TOOTH EXTRACTION      Allergies  Allergen Reactions   Penicillins Rash   Penicillin G Rash    Current Outpatient Medications  Medication Sig Dispense Refill   ondansetron (ZOFRAN-ODT) 4 MG disintegrating tablet Take 1 tablet (4 mg total) by mouth every 8 (eight) hours as needed for nausea or vomiting. 30 tablet 0   oxyCODONE-acetaminophen (PERCOCET/ROXICET) 5-325 MG tablet Take 1 tablet by mouth every 4 (four) hours as needed for severe pain (pain score 7-10). 30 tablet 0   No current facility-administered medications for this visit.    Family History Family History  Problem Relation Age of Onset   Heart disease Maternal Grandmother    Hypertension Maternal Grandmother    Heart disease Maternal Grandfather    Uterine cancer Paternal Grandmother        tested/treated   Heart disease Paternal Grandmother    Diabetes Paternal Grandmother        Social History Social History    Tobacco Use   Smoking status: Never    Passive exposure: Never   Smokeless tobacco: Never  Vaping Use   Vaping status: Never Used  Substance Use Topics   Alcohol use: Yes    Comment: soc   Drug use: Never        ROS Full ROS of systems performed and is otherwise negative there than what is stated in the HPI  Physical Exam Blood pressure 117/74, pulse (!) 102, temperature 98 F (36.7 C), height 5\' 4"  (1.626 m), weight 147 lb (66.7 kg), last menstrual period 03/15/2023, SpO2 99%.  No acute distress, normal work of breathing on room air, clear to auscultation bilaterally, regular rate and rhythm, abdomen is soft, nontender nondistended.  There are no surgical scars.  Moving all extremities spontaneously  Data Reviewed I reviewed her ultrasounds.  She has an ultrasound that shows stones in the gallbladder lumen. Her labs are notable for a normal creatinine and a normal bilirubin.  At her ED visit she did not have a leukocytosis  I have personally reviewed the patient's imaging and medical records.    Assessment    23 year old with symptomatic cholelithiasis  Plan    Discussed given her classic symptoms of biliary colic I recommended robotic assisted cholecystectomy.  She wishes to proceed.  I discussed the risk, benefits and alternatives of the procedure including risk infection, bleeding, common bile duct  injury, bile leak, retained stone and need for open procedure.  She understands these risks and wishes to proceed    Kandis Cocking 04/03/2023, 2:27 PM

## 2023-04-04 NOTE — Telephone Encounter (Signed)
Called patient back, she is now informed of all dates regarding surgery.

## 2023-04-08 ENCOUNTER — Ambulatory Visit: Payer: BC Managed Care – PPO | Admitting: Gastroenterology

## 2023-04-08 ENCOUNTER — Other Ambulatory Visit (INDEPENDENT_AMBULATORY_CARE_PROVIDER_SITE_OTHER): Payer: BC Managed Care – PPO

## 2023-04-08 DIAGNOSIS — Z13220 Encounter for screening for lipoid disorders: Secondary | ICD-10-CM

## 2023-04-08 DIAGNOSIS — E559 Vitamin D deficiency, unspecified: Secondary | ICD-10-CM | POA: Diagnosis not present

## 2023-04-08 DIAGNOSIS — Z1159 Encounter for screening for other viral diseases: Secondary | ICD-10-CM

## 2023-04-08 DIAGNOSIS — Z114 Encounter for screening for human immunodeficiency virus [HIV]: Secondary | ICD-10-CM

## 2023-04-08 DIAGNOSIS — R7309 Other abnormal glucose: Secondary | ICD-10-CM | POA: Diagnosis not present

## 2023-04-08 DIAGNOSIS — Z1329 Encounter for screening for other suspected endocrine disorder: Secondary | ICD-10-CM | POA: Diagnosis not present

## 2023-04-08 DIAGNOSIS — E538 Deficiency of other specified B group vitamins: Secondary | ICD-10-CM

## 2023-04-08 LAB — LIPID PANEL
Cholesterol: 196 mg/dL (ref 0–200)
HDL: 41.5 mg/dL (ref >39.00)
LDL Cholesterol: 117 mg/dL — ABNORMAL HIGH (ref 0–99)
NonHDL: 154.79
Total CHOL/HDL Ratio: 5
Triglycerides: 190 mg/dL — ABNORMAL HIGH (ref 0.0–149.0)
VLDL: 38 mg/dL (ref 0.0–40.0)

## 2023-04-08 LAB — VITAMIN B12: Vitamin B-12: 226 pg/mL (ref 211–911)

## 2023-04-08 LAB — VITAMIN D 25 HYDROXY (VIT D DEFICIENCY, FRACTURES): VITD: 20.29 ng/mL — ABNORMAL LOW (ref 30.00–100.00)

## 2023-04-08 LAB — TSH: TSH: 1.86 u[IU]/mL (ref 0.35–5.50)

## 2023-04-08 LAB — HEMOGLOBIN A1C: Hgb A1c MFr Bld: 5.6 % (ref 4.6–6.5)

## 2023-04-09 LAB — HIV ANTIBODY (ROUTINE TESTING W REFLEX): HIV 1&2 Ab, 4th Generation: NONREACTIVE

## 2023-04-09 LAB — HEPATITIS C ANTIBODY: Hepatitis C Ab: NONREACTIVE

## 2023-04-10 ENCOUNTER — Encounter: Payer: Self-pay | Admitting: Family Medicine

## 2023-04-10 DIAGNOSIS — Z13220 Encounter for screening for lipoid disorders: Secondary | ICD-10-CM | POA: Insufficient documentation

## 2023-04-10 DIAGNOSIS — E559 Vitamin D deficiency, unspecified: Secondary | ICD-10-CM | POA: Insufficient documentation

## 2023-04-10 DIAGNOSIS — Z1159 Encounter for screening for other viral diseases: Secondary | ICD-10-CM | POA: Insufficient documentation

## 2023-04-10 DIAGNOSIS — K8020 Calculus of gallbladder without cholecystitis without obstruction: Secondary | ICD-10-CM | POA: Insufficient documentation

## 2023-04-10 DIAGNOSIS — R634 Abnormal weight loss: Secondary | ICD-10-CM | POA: Insufficient documentation

## 2023-04-10 DIAGNOSIS — D649 Anemia, unspecified: Secondary | ICD-10-CM | POA: Insufficient documentation

## 2023-04-10 DIAGNOSIS — E538 Deficiency of other specified B group vitamins: Secondary | ICD-10-CM | POA: Insufficient documentation

## 2023-04-10 DIAGNOSIS — F39 Unspecified mood [affective] disorder: Secondary | ICD-10-CM | POA: Insufficient documentation

## 2023-04-10 DIAGNOSIS — R7309 Other abnormal glucose: Secondary | ICD-10-CM | POA: Insufficient documentation

## 2023-04-10 DIAGNOSIS — Z114 Encounter for screening for human immunodeficiency virus [HIV]: Secondary | ICD-10-CM | POA: Insufficient documentation

## 2023-04-10 MED ORDER — VITAMIN D (ERGOCALCIFEROL) 1.25 MG (50000 UNIT) PO CAPS
50000.0000 [IU] | ORAL_CAPSULE | ORAL | 1 refills | Status: AC
Start: 2023-04-10 — End: ?

## 2023-04-10 NOTE — Assessment & Plan Note (Signed)
Self-reported anxiety with high scores on anxiety and depression scales. No current treatment plan in place. -Order comprehensive lab work including thyroid function tests, vitamin D, vitamin B12, and hemoglobin to rule out physiological causes of anxiety symptoms. -Provide information on [facility], [facility], and [email or web address] for potential future therapy resources. -Patient not interested in formal evaluation or medication at this time and reports well controlled.

## 2023-04-10 NOTE — Assessment & Plan Note (Addendum)
Recent loss of 15 pounds without intentional dieting or exercise changes. Possibly from mood disorder or recent gallbladder issues. -Investigate potential causes through comprehensive lab work. -Recent CBC, Cmet, Lipase reassuring.  UPT negative

## 2023-04-10 NOTE — Assessment & Plan Note (Signed)
Scheduled for surgical consult for gallbladder removal due to recurrent pain episodes. Currently managing pain with Percocet as needed and Zofran for nausea. -Continue current management plan until surgical consult. -Avoid fatty foods to prevent gallbladder contraction and associated pain.

## 2023-04-15 ENCOUNTER — Encounter
Admission: RE | Admit: 2023-04-15 | Discharge: 2023-04-15 | Disposition: A | Discharge status: Home / Self Care | Payer: BC Managed Care – PPO | Source: Ambulatory Visit | Attending: General Surgery | Admitting: General Surgery

## 2023-04-15 ENCOUNTER — Other Ambulatory Visit: Payer: Self-pay

## 2023-04-15 VITALS — Ht 64.0 in | Wt 145.0 lb

## 2023-04-15 DIAGNOSIS — Z01812 Encounter for preprocedural laboratory examination: Secondary | ICD-10-CM

## 2023-04-15 DIAGNOSIS — K8020 Calculus of gallbladder without cholecystitis without obstruction: Secondary | ICD-10-CM

## 2023-04-15 HISTORY — DX: Calculus of bile duct without cholangitis or cholecystitis without obstruction: K80.50

## 2023-04-15 HISTORY — DX: Headache, unspecified: R51.9

## 2023-04-15 NOTE — Patient Instructions (Addendum)
Your procedure is scheduled on: Monday, Dec 9 Report to the Registration Desk on the 1st floor of the [facility] To find out your arrival time, please call [phone] between 1PM - 3PM on: Friday, Dec 6 If your arrival time is 6:00 am, do not arrive before that time as the [facility] entrance doors do not open until 6:00 am.  REMEMBER: Instructions that are not followed completely may result in serious medical risk, up to and including death; or upon the discretion of your surgeon and anesthesiologist your surgery may need to be rescheduled.  Do not eat food after midnight the night before surgery.  No gum chewing or hard candies.   One week prior to surgery: Stop Anti-inflammatories (NSAIDS) such as Advil, Aleve, Ibuprofen, Motrin, Naproxen, Naprosyn and Aspirin based products such as Excedrin, Goody's Powder, BC Powder. Stop ANY OVER THE COUNTER supplements until after surgery.  You may however, continue to take Tylenol if needed for pain up until the day of surgery.  Continue taking all of your other prescription medications up until the day of surgery.     No Alcohol for 24 hours before or after surgery.  No Smoking including e-cigarettes for 24 hours before surgery.  No chewable tobacco products for at least 6 hours before surgery.  No nicotine patches on the day of surgery.  Do not use any "recreational" drugs for at least a week (preferably 2 weeks) before your surgery.  Please be advised that the combination of cocaine and anesthesia may have negative outcomes, up to and including death. If you test positive for cocaine, your surgery will be cancelled.  On the morning of surgery brush your teeth with toothpaste and water, you may rinse your mouth with mouthwash if you wish. Do not swallow any toothpaste or mouthwash.  Use CHG Soap or wipes as directed on instruction sheet.-provided for you  Do not wear jewelry, make-up, hairpins, clips or nail polish.  For  welded (permanent) jewelry: bracelets, anklets, waist bands, etc.  Please have this removed prior to surgery.  If it is not removed, there is a chance that hospital personnel will need to cut it off on the day of surgery.  Do not wear lotions, powders, or perfumes.   Do not shave body hair from the neck down 48 hours before surgery.  Contact lenses, hearing aids and dentures may not be worn into surgery.  Do not bring valuables to the hospital. [facility] is not responsible for any missing/lost belongings or valuables.    Notify your doctor if there is any change in your medical condition (cold, fever, infection).  Wear comfortable clothing (specific to your surgery type) to the hospital.  After surgery, you can help prevent lung complications by doing breathing exercises.  Take deep breaths and cough every 1-2 hours. Your doctor may order a device called an Incentive Spirometer to help you take deep breaths. When coughing or sneezing, hold a pillow firmly against your incision with both hands. This is called "splinting." Doing this helps protect your incision. It also decreases belly discomfort.  If you are being admitted to the hospital overnight, leave your suitcase in the car. After surgery it may be brought to your room.  In case of increased patient census, it may be necessary for you, the patient, to continue your postoperative care in the Same Day Surgery department.  If you are being discharged the day of surgery, you will not be allowed to drive home. You  will need a responsible individual to drive you home and stay with you for 24 hours after surgery.    Please call the Pre-admissions Testing Dept. at [phone] if you have any questions about these instructions.  Surgery Visitation Policy:  Patients having surgery or a procedure may have two visitors.  Children under the age of 30 must have an adult with them who is not the patient.

## 2023-04-20 MED ORDER — CHLORHEXIDINE GLUCONATE CLOTH 2 % EX PADS
6.0000 | MEDICATED_PAD | Freq: Once | CUTANEOUS | Status: AC
Start: 2023-04-20 — End: 2023-04-21
  Administered 2023-04-21: 6 via TOPICAL

## 2023-04-20 MED ORDER — CHLORHEXIDINE GLUCONATE 0.12 % MT SOLN
15.0000 mL | Freq: Once | OROMUCOSAL | Status: AC
  Administered 2023-04-21: 15 mL via OROMUCOSAL

## 2023-04-20 MED ORDER — SODIUM CHLORIDE 0.9 % IV SOLN
2.0000 g | INTRAVENOUS | Status: AC
  Administered 2023-04-21: 2 g via INTRAVENOUS

## 2023-04-20 MED ORDER — LACTATED RINGERS IV SOLN: INTRAVENOUS | Status: DC

## 2023-04-20 MED ORDER — ORAL CARE MOUTH RINSE: 15.0000 mL | Freq: Once | OROMUCOSAL | Status: AC

## 2023-04-20 MED ORDER — CHLORHEXIDINE GLUCONATE CLOTH 2 % EX PADS
6.0000 | MEDICATED_PAD | Freq: Once | CUTANEOUS | Status: AC
  Administered 2023-04-21: 6 via TOPICAL

## 2023-04-20 MED ORDER — INDOCYANINE GREEN 25 MG IV SOLR
1.2500 mg | Freq: Once | INTRAVENOUS | Status: AC
Start: 2023-04-20 — End: 2023-04-21
  Administered 2023-04-21: 1.25 mg via INTRAVENOUS
  Filled 2023-04-20: qty 0.5

## 2023-04-21 ENCOUNTER — Encounter
Admission: RE | Disposition: A | Discharge status: Home / Self Care | Payer: Self-pay | Source: Home / Self Care | Attending: General Surgery

## 2023-04-21 ENCOUNTER — Ambulatory Visit
Admission: RE | Admit: 2023-04-21 | Discharge: 2023-04-21 | Disposition: A | Discharge status: Home / Self Care | Payer: BC Managed Care – PPO | Attending: General Surgery | Admitting: General Surgery

## 2023-04-21 ENCOUNTER — Other Ambulatory Visit: Payer: Self-pay

## 2023-04-21 ENCOUNTER — Ambulatory Visit: Payer: BC Managed Care – PPO | Admitting: Anesthesiology

## 2023-04-21 ENCOUNTER — Ambulatory Visit: Payer: Self-pay | Admitting: Anesthesiology

## 2023-04-21 ENCOUNTER — Encounter: Payer: Self-pay | Admitting: General Surgery

## 2023-04-21 DIAGNOSIS — R519 Headache, unspecified: Secondary | ICD-10-CM | POA: Diagnosis not present

## 2023-04-21 DIAGNOSIS — Z01812 Encounter for preprocedural laboratory examination: Secondary | ICD-10-CM

## 2023-04-21 DIAGNOSIS — K8010 Calculus of gallbladder with chronic cholecystitis without obstruction: Secondary | ICD-10-CM | POA: Insufficient documentation

## 2023-04-21 DIAGNOSIS — K8020 Calculus of gallbladder without cholecystitis without obstruction: Secondary | ICD-10-CM | POA: Diagnosis not present

## 2023-04-21 DIAGNOSIS — K8050 Calculus of bile duct without cholangitis or cholecystitis without obstruction: Secondary | ICD-10-CM

## 2023-04-21 LAB — POCT PREGNANCY, URINE: Preg Test, Ur: NEGATIVE

## 2023-04-21 SURGERY — CHOLECYSTECTOMY, ROBOT-ASSISTED, LAPAROSCOPIC
Anesthesia: General | Site: Abdomen

## 2023-04-21 MED ORDER — BUPIVACAINE-EPINEPHRINE (PF) 0.5% -1:200000 IJ SOLN
INTRAMUSCULAR | Status: DC | PRN
  Administered 2023-04-21: 10 mL

## 2023-04-21 MED ORDER — OXYCODONE HCL 5 MG PO TABS
ORAL_TABLET | ORAL | Status: AC
  Filled 2023-04-21: qty 1

## 2023-04-21 MED ORDER — DEXAMETHASONE SODIUM PHOSPHATE 10 MG/ML IJ SOLN
INTRAMUSCULAR | Status: DC | PRN
  Administered 2023-04-21: 10 mg via INTRAVENOUS

## 2023-04-21 MED ORDER — BUPIVACAINE LIPOSOME 1.3 % IJ SUSP
INTRAMUSCULAR | Status: AC
  Filled 2023-04-21: qty 10

## 2023-04-21 MED ORDER — BUPIVACAINE LIPOSOME 1.3 % IJ SUSP
INTRAMUSCULAR | Status: DC | PRN
  Administered 2023-04-21: 10 mL

## 2023-04-21 MED ORDER — CEFOTETAN DISODIUM 2 G IJ SOLR
INTRAMUSCULAR | Status: AC
  Filled 2023-04-21: qty 2

## 2023-04-21 MED ORDER — MIDAZOLAM HCL 2 MG/2ML IJ SOLN
INTRAMUSCULAR | Status: DC | PRN
  Administered 2023-04-21: 2 mg via INTRAVENOUS

## 2023-04-21 MED ORDER — KETOROLAC TROMETHAMINE 30 MG/ML IJ SOLN
INTRAMUSCULAR | Status: DC | PRN
  Administered 2023-04-21: 30 mg via INTRAVENOUS

## 2023-04-21 MED ORDER — BUPIVACAINE-EPINEPHRINE (PF) 0.5% -1:200000 IJ SOLN
INTRAMUSCULAR | Status: AC
  Filled 2023-04-21: qty 30

## 2023-04-21 MED ORDER — ONDANSETRON HCL 4 MG/2ML IJ SOLN
INTRAMUSCULAR | Status: DC | PRN
  Administered 2023-04-21 (×2): 4 mg via INTRAVENOUS

## 2023-04-21 MED ORDER — GLYCOPYRROLATE 0.2 MG/ML IJ SOLN
INTRAMUSCULAR | Status: DC | PRN
  Administered 2023-04-21: .2 mg via INTRAVENOUS

## 2023-04-21 MED ORDER — FENTANYL CITRATE (PF) 100 MCG/2ML IJ SOLN
INTRAMUSCULAR | Status: DC | PRN
  Administered 2023-04-21 (×2): 50 ug via INTRAVENOUS

## 2023-04-21 MED ORDER — LIDOCAINE HCL (CARDIAC) PF 100 MG/5ML IV SOSY
PREFILLED_SYRINGE | INTRAVENOUS | Status: DC | PRN
  Administered 2023-04-21: 80 mg via INTRAVENOUS

## 2023-04-21 MED ORDER — OXYCODONE HCL 5 MG PO TABS
5.0000 mg | ORAL_TABLET | Freq: Once | ORAL | Status: AC | PRN
  Administered 2023-04-21: 5 mg via ORAL

## 2023-04-21 MED ORDER — SUGAMMADEX SODIUM 200 MG/2ML IV SOLN
INTRAVENOUS | Status: DC | PRN
  Administered 2023-04-21: 160 mg via INTRAVENOUS

## 2023-04-21 MED ORDER — PROPOFOL 10 MG/ML IV BOLUS
INTRAVENOUS | Status: DC | PRN
  Administered 2023-04-21: 200 mg via INTRAVENOUS

## 2023-04-21 MED ORDER — ACETAMINOPHEN 10 MG/ML IV SOLN
INTRAVENOUS | Status: DC | PRN
  Administered 2023-04-21: 1000 mg via INTRAVENOUS

## 2023-04-21 MED ORDER — ROCURONIUM BROMIDE 100 MG/10ML IV SOLN
INTRAVENOUS | Status: DC | PRN
  Administered 2023-04-21: 10 mg via INTRAVENOUS

## 2023-04-21 MED ORDER — OXYCODONE HCL 5 MG/5ML PO SOLN: 5.0000 mg | Freq: Once | ORAL | Status: AC | PRN

## 2023-04-21 MED ORDER — ESMOLOL HCL 100 MG/10ML IV SOLN
INTRAVENOUS | Status: DC | PRN
  Administered 2023-04-21 (×3): 10 mg via INTRAVENOUS

## 2023-04-21 MED ORDER — PROPOFOL 10 MG/ML IV BOLUS
INTRAVENOUS | Status: AC
  Filled 2023-04-21: qty 20

## 2023-04-21 MED ORDER — OXYCODONE HCL 5 MG PO TABS: 5.0000 mg | ORAL_TABLET | Freq: Four times a day (QID) | ORAL | 0 refills | Status: DC | PRN

## 2023-04-21 MED ORDER — DEXMEDETOMIDINE HCL IN NACL 200 MCG/50ML IV SOLN
INTRAVENOUS | Status: DC | PRN
  Administered 2023-04-21: 20 ug via INTRAVENOUS

## 2023-04-21 MED ORDER — FENTANYL CITRATE (PF) 100 MCG/2ML IJ SOLN
INTRAMUSCULAR | Status: AC
  Filled 2023-04-21: qty 2

## 2023-04-21 MED ORDER — MIDAZOLAM HCL 2 MG/2ML IJ SOLN
INTRAMUSCULAR | Status: AC
  Filled 2023-04-21: qty 2

## 2023-04-21 MED ORDER — SUCCINYLCHOLINE CHLORIDE 200 MG/10ML IV SOSY
PREFILLED_SYRINGE | INTRAVENOUS | Status: DC | PRN
  Administered 2023-04-21: 80 mg via INTRAVENOUS

## 2023-04-21 MED ORDER — FENTANYL CITRATE (PF) 100 MCG/2ML IJ SOLN
25.0000 ug | INTRAMUSCULAR | Status: DC | PRN
  Administered 2023-04-21 (×2): 25 ug via INTRAVENOUS

## 2023-04-21 MED ORDER — PROPOFOL 1000 MG/100ML IV EMUL
INTRAVENOUS | Status: AC
  Filled 2023-04-21: qty 100

## 2023-04-21 MED ORDER — CHLORHEXIDINE GLUCONATE 0.12 % MT SOLN
OROMUCOSAL | Status: AC
  Filled 2023-04-21: qty 15

## 2023-04-21 SURGICAL SUPPLY — 37 items
CANNULA REDUCER 12-8 DVNC XI (CANNULA) ×1 IMPLANT
CAUTERY HOOK MNPLR 1.6 DVNC XI (INSTRUMENTS) ×1 IMPLANT
CLIP LIGATING HEMO O LOK GREEN (MISCELLANEOUS) ×1 IMPLANT
DERMABOND ADVANCED .7 DNX12 (GAUZE/BANDAGES/DRESSINGS) ×1 IMPLANT
DRAPE ARM DVNC X/XI (DISPOSABLE) ×4 IMPLANT
DRAPE COLUMN DVNC XI (DISPOSABLE) ×1 IMPLANT
ELECT REM PT RETURN 9FT ADLT (ELECTROSURGICAL) ×1
ELECTRODE REM PT RTRN 9FT ADLT (ELECTROSURGICAL) ×1 IMPLANT
FORCEPS BPLR R/ABLATION 8 DVNC (INSTRUMENTS) ×1 IMPLANT
FORCEPS PROGRASP DVNC XI (FORCEP) ×1 IMPLANT
GLOVE BIOGEL PI IND STRL 7.5 (GLOVE) ×2 IMPLANT
GLOVE SURG SYN 7.0 (GLOVE) ×2 IMPLANT
GLOVE SURG SYN 7.0 PF PI (GLOVE) ×2 IMPLANT
GOWN STRL REUS W/ TWL LRG LVL3 (GOWN DISPOSABLE) ×3 IMPLANT
GRASPER SUT TROCAR 14GX15 (MISCELLANEOUS) ×1 IMPLANT
KIT PINK PAD W/HEAD ARE REST (MISCELLANEOUS) ×1
KIT PINK PAD W/HEAD ARM REST (MISCELLANEOUS) ×1 IMPLANT
LABEL OR SOLS (LABEL) ×1 IMPLANT
NDL HYPO 22X1.5 SAFETY MO (MISCELLANEOUS) ×1 IMPLANT
NDL INSUFFLATION 14GA 120MM (NEEDLE) ×1 IMPLANT
NEEDLE HYPO 22X1.5 SAFETY MO (MISCELLANEOUS) ×1 IMPLANT
NEEDLE INSUFFLATION 14GA 120MM (NEEDLE) ×1 IMPLANT
NEEDLE VERESS 14GA 120MM (NEEDLE) IMPLANT
NS IRRIG 500ML POUR BTL (IV SOLUTION) ×1 IMPLANT
OBTURATOR OPTICAL STND 8 DVNC (TROCAR) ×1
OBTURATOR OPTICALSTD 8 DVNC (TROCAR) ×1 IMPLANT
PACK LAP CHOLECYSTECTOMY (MISCELLANEOUS) ×1 IMPLANT
SEAL UNIV 5-12 XI (MISCELLANEOUS) ×4 IMPLANT
SET TUBE SMOKE EVAC HIGH FLOW (TUBING) ×1 IMPLANT
SOL ELECTROSURG ANTI STICK (MISCELLANEOUS) ×1
SOLUTION ELECTROSURG ANTI STCK (MISCELLANEOUS) ×1 IMPLANT
SUT MNCRL 4-0 27XMFL (SUTURE) ×1
SUT VICRYL 0 UR6 27IN ABS (SUTURE) ×1 IMPLANT
SUTURE MNCRL 4-0 27XMF (SUTURE) ×1 IMPLANT
SYS BAG RETRIEVAL 10MM (BASKET) ×1
SYSTEM BAG RETRIEVAL 10MM (BASKET) ×1 IMPLANT
WATER STERILE IRR 500ML POUR (IV SOLUTION) ×1 IMPLANT

## 2023-04-21 NOTE — Anesthesia Preprocedure Evaluation (Signed)
Anesthesia Evaluation  Patient identified by MRN, date of birth, ID band Patient awake    Reviewed: Allergy & Precautions, NPO status , Patient's Chart, lab work & pertinent test results  History of Anesthesia Complications Negative for: history of anesthetic complications  Airway Mallampati: II  TM Distance: >3 FB Neck ROM: full    Dental  (+) Chipped Permanent retainer :   Pulmonary neg pulmonary ROS, neg shortness of breath   Pulmonary exam normal        Cardiovascular Exercise Tolerance: Good (-) angina (-) Past MI negative cardio ROS Normal cardiovascular exam     Neuro/Psych  Headaches PSYCHIATRIC DISORDERS         GI/Hepatic negative GI ROS, Neg liver ROS,neg GERD  ,,  Endo/Other  negative endocrine ROS    Renal/GU      Musculoskeletal   Abdominal   Peds  Hematology negative hematology ROS (+)   Anesthesia Other Findings Past Medical History: No date: Anxiety No date: Biliary colic 11/10/2017: Eczema No date: Headache No date: MRSA infection No date: Vaccine for human papilloma virus (HPV) types 6, 11, 16, and  18 administered  Past Surgical History: No date: WISDOM TOOTH EXTRACTION  BMI    Body Mass Index: 24.89 kg/m      Reproductive/Obstetrics negative OB ROS                             Anesthesia Physical Anesthesia Plan  ASA: 2  Anesthesia Plan: General ETT   Post-op Pain Management:    Induction: Intravenous  PONV Risk Score and Plan: Ondansetron, Dexamethasone, Midazolam and Treatment may vary due to age or medical condition  Airway Management Planned: Oral ETT  Additional Equipment:   Intra-op Plan:   Post-operative Plan: Extubation in OR  Informed Consent: I have reviewed the patients History and Physical, chart, labs and discussed the procedure including the risks, benefits and alternatives for the proposed anesthesia with the patient or  authorized representative who has indicated his/her understanding and acceptance.     Dental Advisory Given  Plan Discussed with: Anesthesiologist, CRNA and Surgeon  Anesthesia Plan Comments: (Patient consented for risks of anesthesia including but not limited to:  - adverse reactions to medications - damage to eyes, teeth, lips or other oral mucosa - nerve damage due to positioning  - sore throat or hoarseness - Damage to heart, brain, nerves, lungs, other parts of body or loss of life  Patient voiced understanding and assent.)       Anesthesia Quick Evaluation

## 2023-04-21 NOTE — Transfer of Care (Signed)
Immediate Anesthesia Transfer of Care Note  Patient: Vanessa Scott  Procedure(s) Performed: XI ROBOTIC ASSISTED LAPAROSCOPIC CHOLECYSTECTOMY (Abdomen) INDOCYANINE GREEN FLUORESCENCE IMAGING (ICG) (Abdomen)  Patient Location: PACU  Anesthesia Type:General  Level of Consciousness: awake, drowsy, and patient cooperative  Airway & Oxygen Therapy: Patient Spontanous Breathing and Patient connected to face mask oxygen  Post-op Assessment: Report given to RN and Post -op Vital signs reviewed and stable  Post vital signs: Reviewed and stable  Last Vitals:  Vitals Value Taken Time  BP 104/57 04/21/23 0900  Temp 36.7 C 04/21/23 0900  Pulse 99 04/21/23 0903  Resp 26 04/21/23 0903  SpO2 96 % 04/21/23 0903  Vitals shown include unfiled device data.  Last Pain:  Vitals:   04/21/23 0900  TempSrc:   PainSc: 7          Complications: No notable events documented.

## 2023-04-21 NOTE — Anesthesia Procedure Notes (Signed)
Procedure Name: Intubation Date/Time: 04/21/2023 7:39 AM  Performed by: Mohammed Kindle, CRNAPre-anesthesia Checklist: Patient identified, Emergency Drugs available, Suction available and Patient being monitored Patient Re-evaluated:Patient Re-evaluated prior to induction Oxygen Delivery Method: Circle system utilized Preoxygenation: Pre-oxygenation with 100% oxygen Induction Type: IV induction Ventilation: Mask ventilation without difficulty Laryngoscope Size: McGrath and 3 Grade View: Grade I Tube type: Oral Tube size: 6.5 mm Number of attempts: 1 Airway Equipment and Method: Stylet Placement Confirmation: ETT inserted through vocal cords under direct vision, positive ETCO2 and breath sounds checked- equal and bilateral Secured at: 21 cm Tube secured with: Tape Dental Injury: Teeth and Oropharynx as per pre-operative assessment

## 2023-04-21 NOTE — H&P (Signed)
No changes to below H and P, proceed as planned with robotic assisted cholecystectomy.   CC: Symptomatic Cholelithiasis  History of Present Illness Vanessa Scott is a 22 y.o. female with no significant past medical history that presents to our office in consultation for symptomatic cholelithiasis.  The patient reports that over the last several months she has had right upper quadrant and epigastric abdominal pain after eating.  She says it is worse with spicy and fatty foods.  She denies any nausea or vomiting when she has these episodes.  She reports that the pain last for a couple of hours had a sharpness but then she will have dull pain over the next several days.  She has an ultrasound that shows cholelithiasis done at an ED visit for the same problem.   Past Medical History     Past Medical History:  Diagnosis Date   Anxiety     Eczema 11/10/2017   MRSA infection     Vaccine for human papilloma virus (HPV) types 6, 11, 16, and 18 administered                   Past Surgical History:  Procedure Laterality Date   WISDOM TOOTH EXTRACTION              Allergies      Allergies  Allergen Reactions   Penicillins Rash   Penicillin G Rash              Current Outpatient Medications  Medication Sig Dispense Refill   ondansetron (ZOFRAN-ODT) 4 MG disintegrating tablet Take 1 tablet (4 mg total) by mouth every 8 (eight) hours as needed for nausea or vomiting. 30 tablet 0   oxyCODONE-acetaminophen (PERCOCET/ROXICET) 5-325 MG tablet Take 1 tablet by mouth every 4 (four) hours as needed for severe pain (pain score 7-10). 30 tablet 0      No current facility-administered medications for this visit.        Family History      Family History  Problem Relation Age of Onset   Heart disease Maternal Grandmother     Hypertension Maternal Grandmother     Heart disease Maternal Grandfather     Uterine cancer Paternal Grandmother          tested/treated   Heart disease Paternal  Grandmother     Diabetes Paternal Grandmother              Social History Social History  Social History         Tobacco Use   Smoking status: Never      Passive exposure: Never   Smokeless tobacco: Never  Vaping Use   Vaping status: Never Used  Substance Use Topics   Alcohol use: Yes      Comment: soc   Drug use: Never            ROS Full ROS of systems performed and is otherwise negative there than what is stated in the HPI   Physical Exam Blood pressure 117/74, pulse (!) 102, temperature 98 F (36.7 C), height 5\' 4"  (1.626 m), weight 147 lb (66.7 kg), last menstrual period 03/15/2023, SpO2 99%.   No acute distress, normal work of breathing on room air, clear to auscultation bilaterally, regular rate and rhythm, abdomen is soft, nontender nondistended.  There are no surgical scars.  Moving all extremities spontaneously   Data Reviewed I reviewed her ultrasounds.  She has an ultrasound that shows stones in the gallbladder  lumen. Her labs are notable for a normal creatinine and a normal bilirubin.  At her ED visit she did not have a leukocytosis   I have personally reviewed the patient's imaging and medical records.     Assessment   Assessment 22 year old with symptomatic cholelithiasis   Plan     Plan Discussed given her classic symptoms of biliary colic I recommended robotic assisted cholecystectomy.  She wishes to proceed.  I discussed the risk, benefits and alternatives of the procedure including risk infection, bleeding, common bile duct injury, bile leak, retained stone and need for open procedure.  She understands these risks and wishes to proceed

## 2023-04-21 NOTE — Anesthesia Postprocedure Evaluation (Signed)
Anesthesia Post Note  Patient: Selisa Pease  Procedure(s) Performed: XI ROBOTIC ASSISTED LAPAROSCOPIC CHOLECYSTECTOMY (Abdomen) INDOCYANINE GREEN FLUORESCENCE IMAGING (ICG) (Abdomen)  Patient location during evaluation: PACU Anesthesia Type: General Level of consciousness: awake and alert Pain management: pain level controlled Vital Signs Assessment: post-procedure vital signs reviewed and stable Respiratory status: spontaneous breathing, nonlabored ventilation, respiratory function stable and patient connected to nasal cannula oxygen Cardiovascular status: blood pressure returned to baseline and stable Postop Assessment: no apparent nausea or vomiting Anesthetic complications: no   No notable events documented.   Last Vitals:  Vitals:   04/21/23 1000 04/21/23 1018  BP: 101/64 104/73  Pulse: 89 (!) 109  Resp: 20 15  Temp: 37.1 C 37 C  SpO2: 96% 98%    Last Pain:  Vitals:   04/21/23 1018  TempSrc: Temporal  PainSc: 2                  Cleda Mccreedy [place]

## 2023-04-21 NOTE — Op Note (Signed)
Robotic assisted laparoscopic Cholecystectomy  Pre-operative Diagnosis: Same  Post-operative Diagnosis: Symptomatic CHolelithiasis  Procedure:  Robotic assisted laparoscopic Cholecystectomy  Surgeon: Baker Pierini, MD  Anesthesia: Gen. with endotracheal tube  Findings: Distended gallbladder  Estimated Blood Loss: 5cc       Specimens: Gallbladder           Complications: none   Procedure Details  The patient was seen again in the Holding Room. The benefits, complications, treatment options, and expected outcomes were discussed with the patient. The risks of bleeding, infection, recurrence of symptoms, failure to resolve symptoms, bile duct damage, bile duct leak, retained common bile duct stone, bowel injury, any of which could require further surgery and/or ERCP, stent, or papillotomy were reviewed with the patient. The likelihood of improving the patient's symptoms with return to their baseline status is good.  The patient and/or family concurred with the proposed plan, giving informed consent.  The patient was taken to Operating Room, identified  and the procedure verified as robotic Cholecystectomy.  A Time Out was held and the above information confirmed.  Prior to the induction of general anesthesia, antibiotic prophylaxis was administered. VTE prophylaxis was in place. General endotracheal anesthesia was then administered and tolerated well. After the induction, the abdomen was prepped with Chloraprep and draped in the sterile fashion. The patient was positioned in the supine position.  A veress needle was inserted into the abdomen using standard drop technique. An 8mm infra-umbilical robotic port was then placed under direct visualization. There was no injury noted at the site of veress needle insertion. Two right sided abdominal 8mm ports followed by an 8mm left abdominal robotic ports were placed under direct visualization. The left sided abdominal port was then upsized to a  12mm robotic port.  The patient was positioned  in reverse Trendelenburg, robot was brought to the surgical field and docked in the standard fashion.  We made sure all the instrumentation was kept indirect view at all times and that there were no collision between the arms. I scrubbed out and went to the console.  The gallbladder was identified, the fundus grasped and retracted cephalad. Adhesions were lysed bluntly. The infundibulum was grasped and retracted laterally, exposing the peritoneum overlying the triangle of Calot. This was then divided and exposed in a blunt fashion. An extended critical view of the cystic duct and cystic artery was obtained.  The cystic duct was clearly identified and bluntly dissected.   Artery and duct were double clipped and divided. Using ICG cholangiography we visualized the cystic duct. The gallbladder was taken from the gallbladder fossa in a retrograde fashion with the electrocautery.  Hemostasis was achieved with the electrocautery. nspection of the right upper quadrant was performed. No bleeding, bile duct injury or leak, or bowel injury was noted. Robotic instruments and robotic arms were undocked in the standard fashion.  I scrubbed back in.  The gallbladder was removed and placed in an Endocatch bag.   The left lower quadrant fascia was then closed with a 0 vicryl using a suture needle passer. The pre-peritoneal space was then infiltrated with liposomal bupivicaine and marcaine solution. Pneumoperitoneum was released.  4-0 subcuticular Monocryl was used to close the skin. Dermabond was  applied.  The patient was then extubated and brought to the recovery room in stable condition. Sponge, lap, and needle counts were correct at closure and at the conclusion of the case.               Remi Deter  Maurine Minister, M.D. [facility]

## 2023-04-22 ENCOUNTER — Other Ambulatory Visit: Payer: BC Managed Care – PPO

## 2023-04-22 LAB — SURGICAL PATHOLOGY

## 2023-05-08 ENCOUNTER — Encounter: Payer: BC Managed Care – PPO | Admitting: General Surgery

## 2023-05-08 ENCOUNTER — Ambulatory Visit: Payer: BC Managed Care – PPO | Admitting: General Surgery

## 2023-05-08 ENCOUNTER — Encounter: Payer: Self-pay | Admitting: General Surgery

## 2023-05-08 VITALS — BP 105/73 | HR 91 | Temp 98.0°F | Ht 64.0 in | Wt 141.0 lb

## 2023-05-08 DIAGNOSIS — K8020 Calculus of gallbladder without cholecystitis without obstruction: Secondary | ICD-10-CM

## 2023-05-08 DIAGNOSIS — K8050 Calculus of bile duct without cholangitis or cholecystitis without obstruction: Secondary | ICD-10-CM

## 2023-05-08 NOTE — Patient Instructions (Signed)

## 2023-05-08 NOTE — Progress Notes (Signed)
Vanessa Scott returns today status post robotic assisted cholecystectomy.  She reports doing well.  She is tolerating a regular diet and having normal bowel function.  She says that her pain has resolved.  On exam her abdomen is soft, some tenderness over the left lower quadrant incision.  There is surgical glue on her incisions.  There is no evidence of hernia.  Her pathology was consistent with chronic cholecystitis with cholelithiasis.  I discussed this pathology with the patient.  She can follow-up with [place] on an as-needed basis.  Continue lifting restrictions for another 2 weeks of no greater than 15 pounds.  She can now submerge the wound

## 2023-05-19 ENCOUNTER — Telehealth: Payer: Self-pay | Admitting: *Deleted

## 2023-05-19 NOTE — Telephone Encounter (Signed)
 Patient would like to get a note uploaded in Mychart stating that she can be cleared to regular activities. She is starting a new job and wants to have a note for them

## 2023-09-04 ENCOUNTER — Ambulatory Visit
Admission: RE | Admit: 2023-09-04 | Discharge: 2023-09-04 | Disposition: A | Discharge status: Home / Self Care | Source: Ambulatory Visit | Attending: Internal Medicine | Admitting: Internal Medicine

## 2023-09-04 ENCOUNTER — Other Ambulatory Visit: Payer: Self-pay

## 2023-09-04 VITALS — BP 108/75 | HR 108 | Temp 98.1°F | Resp 16

## 2023-09-04 DIAGNOSIS — R350 Frequency of micturition: Secondary | ICD-10-CM | POA: Diagnosis not present

## 2023-09-04 DIAGNOSIS — N3001 Acute cystitis with hematuria: Secondary | ICD-10-CM | POA: Diagnosis not present

## 2023-09-04 DIAGNOSIS — R300 Dysuria: Secondary | ICD-10-CM | POA: Insufficient documentation

## 2023-09-04 LAB — POCT URINALYSIS DIP (MANUAL ENTRY)
Bilirubin, UA: NEGATIVE
Glucose, UA: NEGATIVE mg/dL
Ketones, POC UA: NEGATIVE mg/dL
Nitrite, UA: NEGATIVE
Protein Ur, POC: >=300 mg/dL — AB
Spec Grav, UA: >=1.03 — AB
Urobilinogen, UA: 0.2 U/dL
pH, UA: 6

## 2023-09-04 MED ORDER — NITROFURANTOIN MONOHYD MACRO 100 MG PO CAPS: 100.0000 mg | ORAL_CAPSULE | Freq: Two times a day (BID) | ORAL | 0 refills | Status: DC

## 2023-09-04 NOTE — ED Provider Notes (Signed)
 [facility]    CSN: [id number] Arrival date & time: 09/04/23  1124      History   Chief Complaint Chief Complaint  Patient presents with   Urinary Frequency    I think I may have a UTI. - Entered by patient    HPI Vanessa Scott is a 23 y.o. female.   Patient presents with a 3 day history of dysuria and urinary frequency. Denies hematuria, fever, chills, but does reports some midline lower back pain since symptoms started. Last menstrual cycle was 08/26/23.    Urinary Frequency    Past Medical History:  Diagnosis Date   Anxiety    Biliary colic    Eczema 11/10/2017   Headache    MRSA infection    Vaccine for human papilloma virus (HPV) types 6, 11, 16, and 18 administered     Patient Active Problem List   Diagnosis Date Noted   Cholelithiasis 04/10/2023   Mood disorder (HCC) 04/10/2023   Unintentional weight loss 04/10/2023   Need for hepatitis C screening test 04/10/2023   Encounter for screening for HIV 04/10/2023   Vitamin D  deficiency 04/10/2023   Vitamin B 12 deficiency 04/10/2023   Lipid screening 04/10/2023   Abnormal glucose 04/10/2023   Anemia 04/10/2023   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 Authorizing Provider  nitrofurantoin , macrocrystal-monohydrate, (MACROBID ) 100 MG capsule Take 1 capsule (100 mg total) by mouth 2 (two) times daily. 09/04/23  Yes Desaray Marschner, Dewey Fordyce, FNP  acetaminophen  (TYLENOL ) 325 MG tablet Take 650 mg by mouth every 6 (six) hours as needed for mild pain (pain score 1-3).    [provider]  Vitamin D , Ergocalciferol ,  (DRISDOL ) 1.25 MG (50000 UNIT) CAPS capsule Take 1 capsule (50,000 Units total) by mouth every 7 (seven) days. 04/10/23   Valli Gaw, MD    Family History Family History  Problem Relation Age of Onset   Heart disease Maternal Grandmother    Hypertension Maternal Grandmother    Heart disease Maternal Grandfather    Uterine cancer Paternal Grandmother        tested/treated   Heart disease Paternal Grandmother    Diabetes Paternal Grandmother     Social History Social History   Tobacco Use   Smoking status: Never    Passive exposure: Never   Smokeless tobacco: Never  Vaping Use   Vaping status: Never Used  Substance Use Topics   Alcohol use: Yes    Comment: soc   Drug use: Never     Allergies   Penicillins and Penicillin g   Review of Systems Review of Systems Per HPI  Physical Exam Triage Vital Signs ED Triage Vitals [09/04/23 1136]  Encounter Vitals Group     BP 108/75     Systolic BP Percentile      Diastolic  BP Percentile      Pulse Rate (!) 108     Resp 16     Temp 98.1 F (36.7 C)     Temp Source Oral     SpO2 98 %     Weight      Height      Head Circumference      Peak Flow      Pain Score      Pain Loc      Pain Education      Exclude from Growth Chart    No data found.  Updated Vital Signs BP 108/75 (BP Location: Left Arm)   Pulse (!) 108   Temp 98.1 F (36.7 C) (Oral)   Resp 16   LMP 08/26/2023 (Approximate)   SpO2 98%   Visual Acuity Right Eye Distance:   Left Eye Distance:   Bilateral Distance:    Right Eye Near:   Left Eye Near:    Bilateral Near:     Physical Exam Constitutional:      General: She is not in acute distress.    Appearance: Normal appearance. She is not toxic-appearing or diaphoretic.  HENT:     Head: Normocephalic and atraumatic.  Eyes:     Extraocular Movements: Extraocular movements intact.     Conjunctiva/sclera: Conjunctivae normal.  Pulmonary:     Effort: Pulmonary effort is normal.   Neurological:     General: No focal deficit present.     Mental Status: She is alert and oriented to person, place, and time. Mental status is at baseline.  Psychiatric:        Mood and Affect: Mood normal.        Behavior: Behavior normal.        Thought Content: Thought content normal.        Judgment: Judgment normal.      UC Treatments / Results  Labs (all labs ordered are listed, but only abnormal results are displayed) Labs Reviewed  POCT URINALYSIS DIP (MANUAL ENTRY) - Abnormal; Notable for the following components:      Result Value   Spec Grav, UA >=1.030 (*)    Blood, UA large (*)    Protein Ur, POC >=300 (*)    Leukocytes, UA Large (3+) (*)    All other components within normal limits  URINE CULTURE    EKG   Radiology No results found.  Procedures Procedures (including critical care time)  Medications Ordered in UC Medications - No data to display  Initial Impression / Assessment and Plan / UC Course  I have reviewed the triage vital signs and the nursing notes.  Pertinent labs & imaging results that were available during my care of the patient were reviewed by me and considered in my medical decision making (see chart for details).     UA indicating UTI. Will treat with macrobid . Urine culture pending. Also advised adequate water intake. Advised strict follow up precautions if symptoms persist or worsen. Patient verbalized understanding and was agreeable with plan.  Final Clinical Impressions(s) / UC Diagnoses   Final diagnoses:  Acute cystitis with hematuria  Dysuria  Urinary frequency     Discharge Instructions      You have a urinary tract infection so I have sent an antibiotic to treat this. Drink plenty of water as well. Follow up if symptoms persist or worsen.     ED Prescriptions     Medication Sig Dispense Auth. Provider   nitrofurantoin , macrocrystal-monohydrate, (  MACROBID ) 100 MG capsule Take 1 capsule (100 mg total) by mouth 2  (two) times daily. 10 capsule Chinelo Benn E, [place]      PDMP not reviewed this encounter.   Dodson Freestone, [place] 09/04/23 1151

## 2023-09-04 NOTE — Discharge Instructions (Signed)
 You have a urinary tract infection so I have sent an antibiotic to treat this. Drink plenty of water as well. Follow up if symptoms persist or worsen.

## 2023-09-04 NOTE — ED Triage Notes (Signed)
 Patient presents for dysuria x 3 days.  Home Intervention: None

## 2023-09-06 LAB — URINE CULTURE: Culture: >=100000 — AB

## 2023-10-28 ENCOUNTER — Other Ambulatory Visit: Payer: Self-pay

## 2023-10-28 ENCOUNTER — Ambulatory Visit
Admission: RE | Admit: 2023-10-28 | Discharge: 2023-10-28 | Disposition: A | Discharge status: Home / Self Care | Source: Ambulatory Visit | Attending: Family Medicine | Admitting: Family Medicine

## 2023-10-28 VITALS — BP 125/78 | HR 107 | Temp 98.4°F | Resp 18

## 2023-10-28 DIAGNOSIS — R000 Tachycardia, unspecified: Secondary | ICD-10-CM | POA: Diagnosis not present

## 2023-10-28 DIAGNOSIS — R531 Weakness: Secondary | ICD-10-CM | POA: Diagnosis not present

## 2023-10-28 DIAGNOSIS — R0602 Shortness of breath: Secondary | ICD-10-CM

## 2023-10-28 LAB — POCT URINALYSIS DIP (MANUAL ENTRY)
Bilirubin, UA: NEGATIVE
Blood, UA: NEGATIVE
Glucose, UA: NEGATIVE mg/dL
Leukocytes, UA: NEGATIVE
Nitrite, UA: NEGATIVE
Protein Ur, POC: NEGATIVE mg/dL
Spec Grav, UA: 1.025 (ref 1.010–1.025)
Urobilinogen, UA: 0.2 U/dL
pH, UA: 6 (ref 5.0–8.0)

## 2023-10-28 LAB — POCT FASTING CBG KUC MANUAL ENTRY: POCT Glucose (KUC): 128 mg/dL — AB (ref 70–99)

## 2023-10-28 LAB — POCT URINE PREGNANCY: Preg Test, Ur: NEGATIVE

## 2023-10-28 NOTE — ED Notes (Signed)
 Patient is being discharged from the Urgent Care and sent to the Emergency Department via POV . Per Angie PA, patient is in need of higher level of care due to dizziness, tachycardia, SOB. Patient is aware and verbalizes understanding of plan of care.  Vitals:   10/28/23 1704 10/28/23 1808  BP: 115/84 125/78  Pulse: (!) 103 (!) 107  Resp: 20 18  Temp: 98.4 F (36.9 C)

## 2023-10-28 NOTE — Discharge Instructions (Signed)
 Go directly to the emergency department for further evaluation and treatment Your symptoms worsen or you pass out the driver should pull over and call 911

## 2023-10-28 NOTE — ED Triage Notes (Signed)
 Pt is experiencing these sx's: ~115 resting heart rate Dizzy Weak Shaking/jumping Brain fog Tunnel vision Shortness of breath and Hot flashes that started 4 hours ago today. Pt has not taken anything at home. Pt does have hx of anxiety with no other medical problems.

## 2023-10-28 NOTE — ED Provider Notes (Signed)
 [facility]    CSN: [id number] Arrival date & time: 10/28/23  1653      History   Chief Complaint Chief Complaint  Patient presents with   Dizziness    I'm experiencing these symptoms:~115 resting heart rateDizzyWeakShaking/jumpingBrain fog Tunnel vision Shortness of breath Hot flashes - Entered by patient    HPI Vanessa Scott is a 23 y.o. female.   The history is provided by the patient.  Dizziness Associated symptoms: palpitations (Feels heart racing highest documented heart rate in the 160s per her watch), shortness of breath (Feel short of breath when she talks even if she is at rest) and weakness (Generalized)   Associated symptoms: no chest pain, no diarrhea, no headaches, no nausea and no vomiting   Not feeling well gradual onset around 130 today felt generalized weakness and heaviness associated with  heart racing, confirmed the rate with her Apple Watch, rate has been consistently high with a maximum number of 165, admits feeling dizzy described as lightheaded, has felt short of breath when she talks, has had some hot flashes, abrupt onset, intermittent, associated with sweating.  She also feels brain fog and tunnel vision.  Denies syncope Has history of rapid heart rate in the past saw was told she has an arrhythmia years ago after wearing a monitor, she does not recall the type of arrhythmia. Travel to [place]  this weekend approximately 3.5 and half hours by car.  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 Drinks occasionally.  Admits to several stressors no significant life stressors at this time Had full breakfast this morning, and some bagel bites for lunch after her symptoms began. Denies recent illness, fever, chills, sweats, cough, chest pain, palpitations, nausea, vomiting, diarrhea, urinary symptoms, vaginal discharge, back pain, headache, change in vision, recent illness, menstrual irregularity.  LMP was 10/16/2023 described as normal.  Denies new  medication, food or over-the-counter supplement.  Denies recreational drug use.  Drinks 1 cup of coffee daily denies use of energy drinks. 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 Had her gallbladder out December 2024.  Has a history of tension headaches.  Admits frequent episodes of elevated heart rate in the past. G0, P0 A0   Past Medical History:  Diagnosis Date   Anxiety    Biliary colic    Eczema 11/10/2017   Headache    MRSA infection    Vaccine for human papilloma virus (HPV) types 6, 11, 16, and 18 administered     Patient Active Problem List   Diagnosis Date Noted   Cholelithiasis 04/10/2023   Mood disorder (HCC) 04/10/2023   Unintentional weight loss 04/10/2023   Need for hepatitis C screening test 04/10/2023   Encounter for screening for HIV 04/10/2023   Vitamin D  deficiency 04/10/2023   Vitamin B 12 deficiency 04/10/2023   Lipid screening 04/10/2023   Abnormal glucose 04/10/2023   Anemia 04/10/2023   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   Headache disorder 02/11/2019    Past Surgical History:  Procedure Laterality Date   WISDOM TOOTH EXTRACTION      OB History     Gravida  0   Para  0   Term  0   Preterm  0   AB  0   Living  0      SAB  0   IAB  0   Ectopic  0   Multiple  0   Live Births  0  Home Medications    Prior to Admission medications   Not on File    Family History Family History  Problem Relation Age of Onset   Heart disease Maternal Grandmother    Hypertension Maternal Grandmother    Heart disease Maternal Grandfather    Uterine cancer Paternal Grandmother        tested/treated   Heart disease Paternal Grandmother    Diabetes Paternal Grandmother     Social History Social History   Tobacco Use   Smoking status: Never    Passive exposure: Never   Smokeless tobacco: Never  Vaping Use   Vaping status: Never Used  Substance Use Topics   Alcohol use: Yes    Comment: soc   Drug use: Never      Allergies   Penicillins and Penicillin g   Review of Systems Review of Systems  Constitutional:  Positive for fatigue. Negative for appetite change, chills, diaphoresis and fever.  HENT:  Negative for congestion, ear pain, rhinorrhea, sore throat and voice change.   Respiratory:  Positive for shortness of breath (Feel short of breath when she talks even if she is at rest). Negative for chest tightness.   Cardiovascular:  Positive for palpitations (Feels heart racing highest documented heart rate in the 160s per her watch). Negative for chest pain and leg swelling.  Gastrointestinal:  Negative for abdominal pain, diarrhea, nausea and vomiting.  Genitourinary:  Negative for decreased urine volume, difficulty urinating, frequency, hematuria, menstrual problem and pelvic pain.  Skin:  Negative for rash.  Neurological:  Positive for dizziness (Described as feeling of lightheadedness and off-balance) and weakness (Generalized). Negative for headaches.     Physical Exam Triage Vital Signs ED Triage Vitals  Encounter Vitals Group     BP 10/28/23 1704 115/84     Girls Systolic BP Percentile --      Girls Diastolic BP Percentile --      Boys Systolic BP Percentile --      Boys Diastolic BP Percentile --      Pulse Rate 10/28/23 1704 (!) 103     Resp 10/28/23 1704 20     Temp 10/28/23 1704 98.4 F (36.9 C)     Temp Source 10/28/23 1704 Oral     SpO2 --      Weight --      Height --      Head Circumference --      Peak Flow --      Pain Score 10/28/23 1702 0     Pain Loc --      Pain Education --      Exclude from Growth Chart --    Orthostatic VS for the past 24 hrs:  BP- Lying Pulse- Lying BP- Sitting Pulse- Sitting BP- Standing at 0 minutes Pulse- Standing at 0 minutes  10/28/23 1721 115/73 110 120/86 111 120/78 130    Updated Vital Signs BP 125/78 (BP Location: Right Arm)   Pulse (!) 107   Temp 98.4 F (36.9 C) (Oral)   Resp 18   LMP 10/16/2023 (Exact Date)    Visual Acuity Right Eye Distance:   Left Eye Distance:   Bilateral Distance:    Right Eye Near:   Left Eye Near:    Bilateral Near:     Physical Exam Vitals and nursing note reviewed.  Constitutional:      Appearance: She is not ill-appearing or toxic-appearing.  HENT:     Head: Normocephalic.     Right  Ear: Tympanic membrane and ear canal normal.     Left Ear: Tympanic membrane and ear canal normal.     Nose: No rhinorrhea.     Mouth/Throat:     Mouth: Mucous membranes are moist.     Pharynx: Oropharynx is clear.   Eyes:     Conjunctiva/sclera: Conjunctivae normal.    Cardiovascular:     Rate and Rhythm: Tachycardia present.     Heart sounds: Normal heart sounds.  Pulmonary:     Effort: Pulmonary effort is normal. No respiratory distress.     Breath sounds: Normal breath sounds. No wheezing, rhonchi or rales.  Abdominal:     Palpations: Abdomen is soft.     Tenderness: There is no abdominal tenderness. There is no right CVA tenderness, left CVA tenderness or guarding.   Musculoskeletal:        General: No swelling or tenderness. Normal range of motion.     Cervical back: Neck supple.     Right lower leg: No edema.     Left lower leg: No edema.  Lymphadenopathy:     Cervical: No cervical adenopathy.   Skin:    General: Skin is warm and dry.     Capillary Refill: Capillary refill takes less than 2 seconds.     Findings: No bruising or rash.   Neurological:     Mental Status: She is alert and oriented to person, place, and time.     Cranial Nerves: No cranial nerve deficit.     Motor: No weakness.     Coordination: Coordination normal.     Gait: Gait normal.      UC Treatments / Results  Labs (all labs ordered are listed, but only abnormal results are displayed) Labs Reviewed  POCT URINALYSIS DIP (MANUAL ENTRY) - Abnormal; Notable for the following components:      Result Value   Ketones, POC UA trace (5) (*)    All other components within normal  limits  POCT FASTING CBG [facility] MANUAL ENTRY - Abnormal; Notable for the following components:   POCT Glucose ([facility]) 128 (*)    All other components within normal limits  POCT URINE PREGNANCY    EKG   Radiology No results found.  Procedures Procedures (including critical care time)  Medications Ordered in UC Medications - No data to display  Initial Impression / Assessment and Plan / UC Course  I have reviewed the triage vital signs and the nursing notes.  Pertinent labs & imaging results that were available during my care of the patient were reviewed by me and considered in my medical decision making (see chart for details).     23 year old female presents with tachycardia and complaints of lightheadedness generalized weakness shortness of breath and rapid heart rate persistent for last 4 hours.  She is calm and well-appearing, nontoxic, she is tachycardic at rest initial rate 110 bpm, her exam is normal, she does not have any lower extremity swelling or calf tenderness,Ortho stat vital signs were reviewed her heart rate increased from 110-130 when going from lying to standing, blood sugar 128, pregnancy test negative, urine dip stick slightly concentrated at 1.025 with trace ketones otherwise normal.  EKG independently viewed by me sinus tachycardia with a rate of 105 normal axis normal intervals, no acute ischemic changes, no old EKG available for comparison.  Discussed with patient given her symptoms of weakness and shortness of breath and persistent tachycardia recommend ED evaluation, differential diagnosis includes PE, electrolyte imbalance, dehydration,  thyroid  disorder, anemia.  She appears stable do not feel she needs an ambulance at this time however recommend she have someone with drive her to the emergency department.  She called her father who will be traveling here from [facility] .  We will continue to monitor her until he arrives Patient was discharged in the  care of her father she was stable upon discharge   Final Clinical Impressions(s) / UC Diagnoses   Final diagnoses:  Tachycardia  Generalized weakness  Shortness of breath     Discharge Instructions      Go directly to the emergency department for further evaluation and treatment Your symptoms worsen or you pass out the driver should pull over and call 911     ED Prescriptions   None    PDMP not reviewed this encounter.   Arie Powell, [place] 10/28/23 1816
